# Patient Record
Sex: Male | Born: 1970 | Race: White | Hispanic: No | Marital: Married | Smoking: Never smoker
Health system: Southern US, Community
[De-identification: ages and names within clinical notes are randomized; demographics above are authoritative.]

## PROBLEM LIST (undated history)

## (undated) DIAGNOSIS — G5600 Carpal tunnel syndrome, unspecified upper limb: Secondary | ICD-10-CM

## (undated) DIAGNOSIS — R7303 Prediabetes: Secondary | ICD-10-CM

## (undated) DIAGNOSIS — R1310 Dysphagia, unspecified: Secondary | ICD-10-CM

## (undated) DIAGNOSIS — Z8781 Personal history of (healed) traumatic fracture: Secondary | ICD-10-CM

## (undated) DIAGNOSIS — K219 Gastro-esophageal reflux disease without esophagitis: Secondary | ICD-10-CM

## (undated) DIAGNOSIS — E876 Hypokalemia: Secondary | ICD-10-CM

## (undated) DIAGNOSIS — G4733 Obstructive sleep apnea (adult) (pediatric): Secondary | ICD-10-CM

## (undated) HISTORY — DX: Hypokalemia: E87.6

## (undated) HISTORY — DX: Carpal tunnel syndrome, unspecified upper limb: G56.00

## (undated) HISTORY — DX: Morbid (severe) obesity due to excess calories: E66.01

## (undated) HISTORY — DX: Prediabetes: R73.03

## (undated) HISTORY — DX: Dysphagia, unspecified: R13.10

## (undated) HISTORY — DX: Obstructive sleep apnea (adult) (pediatric): G47.33

## (undated) HISTORY — DX: Gastro-esophageal reflux disease without esophagitis: K21.9

## (undated) HISTORY — DX: Personal history of (healed) traumatic fracture: Z87.81

---

## 1998-08-04 ENCOUNTER — Ambulatory Visit (HOSPITAL_COMMUNITY): Admission: RE | Admit: 1998-08-04 | Discharge: 1998-08-04 | Payer: Self-pay | Admitting: *Deleted

## 2005-09-18 ENCOUNTER — Encounter: Payer: Self-pay | Admitting: Pulmonary Disease

## 2008-07-17 ENCOUNTER — Emergency Department (HOSPITAL_COMMUNITY): Admission: EM | Admit: 2008-07-17 | Discharge: 2008-07-17 | Payer: Self-pay | Admitting: Emergency Medicine

## 2009-08-11 ENCOUNTER — Ambulatory Visit: Payer: Self-pay | Admitting: Pulmonary Disease

## 2009-08-11 DIAGNOSIS — G4733 Obstructive sleep apnea (adult) (pediatric): Secondary | ICD-10-CM | POA: Insufficient documentation

## 2009-08-25 ENCOUNTER — Telehealth: Payer: Self-pay | Admitting: Pulmonary Disease

## 2009-08-31 ENCOUNTER — Encounter: Payer: Self-pay | Admitting: Pulmonary Disease

## 2009-09-26 ENCOUNTER — Telehealth (INDEPENDENT_AMBULATORY_CARE_PROVIDER_SITE_OTHER): Payer: Self-pay | Admitting: *Deleted

## 2009-10-23 ENCOUNTER — Encounter: Payer: Self-pay | Admitting: Pulmonary Disease

## 2010-05-22 ENCOUNTER — Telehealth (INDEPENDENT_AMBULATORY_CARE_PROVIDER_SITE_OTHER): Payer: Self-pay | Admitting: *Deleted

## 2010-05-23 ENCOUNTER — Encounter: Payer: Self-pay | Admitting: Pulmonary Disease

## 2010-07-04 ENCOUNTER — Encounter: Payer: Self-pay | Admitting: Pulmonary Disease

## 2010-07-27 ENCOUNTER — Ambulatory Visit
Admission: RE | Admit: 2010-07-27 | Discharge: 2010-07-27 | Payer: Self-pay | Source: Home / Self Care | Attending: Pulmonary Disease | Admitting: Pulmonary Disease

## 2010-08-10 NOTE — Miscellaneous (Signed)
Summary: Auto BPAP report 06/12/10 to 07/04/10  Clinical Lists Changes Used on 5 of 18 days with average 2hrs 27 min.  Optimal pressure 17/15 with average AHI 12.4.  However, not enough machine usage to adequately determine optimal settings.  Will have my nurse call to schedule next available ROV to discuss status of sleep apnea.  Appended Document: Auto BPAP report 06/12/10 to 07/04/10 [id number] x1  Appended Document: Auto BPAP report 06/12/10 to 07/04/10 [email or web address] x2  Appended Document: Auto BPAP report 06/12/10 to 07/04/10 [id number] X3  Appended Document: Auto BPAP report 06/12/10 to 07/04/10 Pt is coming in 1/19 at 3:00

## 2010-08-10 NOTE — Progress Notes (Signed)
Summary: c pap reading  Phone Note Call from Patient Call back at [phone]   Caller: Significant Other friend Roger Green Call For: sood Summary of Call: calling to get reading results from cpap machine Initial call taken by: Rickard Patience,  September 26, 2009 1:54 PM  Follow-up for Phone Call        pt's family member calling to get results from auto bipap.  Please advise.  thank you.  Roger Millet [name]  September 26, 2009 2:01 PM   Additional Follow-up for Phone Call Additional follow up Details #1::        d/w Roger Green results of download.  Very minimal information from report.  They are not sure if machine is broken. Pt c/o not feeling like any pressure is blowing, and a result can't use.  They have appointment to have machine interogated at [facility] on 03/23.  Will have triage nurse call to schedule patient for next available ROV to review status of OSA and BPAP. Additional Follow-up by: [name] MD,  September 27, 2009 1:04 PM       Appended Document: c pap reading pt girlfriend and pt wants to wait until after they take machine into [facility] tomorrow to see if machine is broken. They state they will call back an let us know what Christoper Allegra finds and if needed then they will schedule an appt.

## 2010-08-10 NOTE — Letter (Signed)
Summary: CMN for CPAP Supplies/[facility]  CMN for CPAP Supplies/[facility]   Imported By: Sherian Rein 11/01/2009 11:10:33  _____________________________________________________________________  External Attachment:    Type:   Image     Comment:   External Document

## 2010-08-10 NOTE — Medication Information (Signed)
Summary: CPAP Compliance/[facility]  CPAP Compliance/[facility]   Imported By: Sherian Rein 10/07/2009 10:24:45  _____________________________________________________________________  External Attachment:    Type:   Image     Comment:   External Document

## 2010-08-10 NOTE — Assessment & Plan Note (Signed)
Summary: discuss sleep apnea/ms   Primary Provider/Referring Provider:  Dr. Donette Larry  CC:  follow-up visit. Pt states he has no used his bpap 1 year and 2 months. Pt states it causes him to have dry mouth and the mask makes him feel like he is choking. Marland Kitchen  History of Present Illness: 40 yo male with severe OSA.  His recent BPAP download from Dec. 2011:  Used on 5 of 18 days with average 2hrs 27 min.  Optimal pressure 17/15 with average AHI 12.4.  However, not enough machine usage to adequately determine optimal settings.  He has been getting dryness in his nose and mouth.  As a result he has to take his mask off.  He has no trouble falling asleep with BPAP.  He feels BPAP is helping otherwise.  He is not as sleep like he was before BPAP set up.  He does not have much leak from his mask.   Current Medications (verified): 1)  Prevacid 30 Mg Cpdr (Lansoprazole) .Marland Kitchen.. 1 By Mouth Daily As Needed  Allergies (verified): No Known Drug Allergies  Past History:  Past Medical History: OSA      - PSG 09/18/05 AHI 84  Past Surgical History: Reviewed history from 08/11/2009 and no changes required. None  Vital Signs:  Patient profile:   40 year old male Height:      73 inches Weight:      414.38 pounds BMI:     54.87 O2 Sat:      95 % on Room air Temp:     99.6 degrees F oral Pulse rate:   98 / minute BP sitting:   128 / 86  (left arm) Cuff size:   large  Vitals Entered By: Carver Fila (July 27, 2010 2:55 PM)  O2 Flow:  Room air CC: follow-up visit. Pt states he has no used his bpap 1 year and 2 months. Pt states it causes him to have dry mouth and the mask makes him feel like he is choking.  Comments meds and allergies updated Phone number updated Carver Fila  July 27, 2010 2:55 PM    Physical Exam  General:  obese.   Nose:  no deformity, discharge, inflammation, or lesions Mouth:  MP 4, enlarged tongue Neck:  no JVD.   Lungs:  clear bilaterally to auscultation and  percussion Heart:  regular rate and rhythm, S1, S2 without murmurs, rubs, gallops, or clicks Extremities:  no clubbing, cyanosis, edema, or deformity noted Neurologic:  normal CN II-XII and strength normal.   Cervical Nodes:  no significant adenopathy Psych:  alert and cooperative; normal mood and affect; normal attention span and concentration   Impression & Recommendations:  Problem # 1:  OBSTRUCTIVE SLEEP APNEA (ICD-327.23) Reviewed his previous sleep test, and recent download results.  Will adjust his BPAP to 17/15.  Will try to get a new machine for him since his current machine is > 5 yrs old.  Will get download on new settings.  If this is unsuccessful, he will need in-lab titration.  Problem # 2:  MORBID OBESITY (ICD-278.01) Explained how his weight is affecting his sleep and health.  Complete Medication List: 1)  Prevacid 30 Mg Cpdr (Lansoprazole) .Marland Kitchen.. 1 by mouth daily as needed  Other Orders: Est. Patient Level III (16109) DME Referral (DME)  Patient Instructions: 1)  Will adjust BPAP to 17/15 cm H2O 2)  Will try to arrange for new BPAP machine 3)  Follow up in 4 months

## 2010-08-10 NOTE — Letter (Signed)
Summary: LMN/[facility]  LMN/[facility]   Imported By: Lester [place] 05/29/2010 08:10:03  _____________________________________________________________________  External Attachment:    Type:   Image     Comment:   External Document

## 2010-08-10 NOTE — Assessment & Plan Note (Signed)
Summary: SLEEP APNEA/[facility]   Primary Provider/Referring Provider:  Dr. Donette Larry  CC:  Sleep consult. Epworth score is 10.   The patient had a sleep study approx 3-4 years ago and has been on Bipap. Pressure 22/18. The [patient has been unable to wear his Bipap for 2 months because he feels he is suffocating.Marland Kitchen  History of Present Illness: 40 yo male for evaluation of sleep apnea.  He had a sleep study done about 3.5 years ago.  This was done in [employer].  He apparently followed a split night protocol.  He was found sleep apnea, and started on BPAP.  He says his pressure setting is 22/18.  He has not had any adjustments in his set up since he started.  He gets his supplies through [facility].  He currently has a full face mask, and uses a humidifer.  He was doing well with his machine until November 2010.  At that time his dog knocked his machine off of it's stand, and he thinks something happened to the machine.  Around that time he went on a hunting trip to [place], and took his machine with him.  When he returned he felt like he was getting smothered whenever he would use his machine.  As a result he has not been using his BPAP machine.  He goes to bed at 9pm, and falls asleep quickly.  He sleeps for about 3 or 4 hours, and wakes up to use the bathroom.  He gets out of bed at 630am.  He feels okay in the morning, and denies headache.  He can fall asleep easily if he is sitting idle.  Since he stopped using BPAP he has been snoring, and can't sleep on his back.  He will occasionally talk in his sleep.  He changed his diet about 2 months ago, and has lost about 15 lbs.  He denies sleep walking, or nightmares.  There is no history of restless legs.  He denies sleep hallucinations, sleep paralysis, or cataplexy.  He is not using anything to help him sleep or stay awake.  He does not have any problems with his driving.  He contacted his DME company to see if something could be done with his machine, and he was  advised to have further sleep evaluation prior to making any adjustments.  Preventive Screening-Counseling & Management  Alcohol-Tobacco     Smoking Status: never  Current Medications (verified): 1)  Prevacid 30 Mg Cpdr (Lansoprazole) .Marland Kitchen.. 1 By Mouth Daily As Needed  Allergies (verified): No Known Drug Allergies  Past History:  Past Medical History: Current Problems:  SLEEP APNEA (ICD-780.57)  Past Surgical History: None  Family History: Reviewed history and no changes required. Heart disease---father and PGF  Social History: Reviewed history and no changes required. Patient never smoked.  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  The patient denies non-productive cough, coughing up blood, chest pain, irregular heartbeats, indigestion, loss of appetite, weight change, abdominal pain, difficulty swallowing, sore throat, tooth/dental problems, headaches, nasal congestion/difficulty breathing through nose, sneezing, itching, anxiety, depression, hand/feet swelling, joint stiffness or pain, rash, change in color of mucus, and fever.    Vital Signs:  Patient profile:   40 year old male Height:      73 inches (185.42 cm) Weight:  390 pounds (177.27 kg) BMI:     51.64 O2 Sat:      93 % on Room air Temp:     99.0 degrees F (37.22 degrees C) oral Pulse rate:   98 / minute BP sitting:   116 / 74  (right arm) Cuff size:   large  Vitals Entered By: Michel Bickers CMA (August 11, 2009 10:25 AM)  O2 Sat at Rest %:  93 O2 Flow:  Room air  Physical Exam  General:  obese.   Eyes:  PERRLA and EOMI.   Nose:  no deformity, discharge, inflammation, or lesions Mouth:  MP 4, enlarged tongue Neck:  no JVD.   Lungs:  clear bilaterally to auscultation and percussion Heart:  regular rate and rhythm, S1, S2  without murmurs, rubs, gallops, or clicks Abdomen:  bowel sounds positive; abdomen soft and non-tender without masses, or organomegaly Extremities:  no clubbing, cyanosis, edema, or deformity noted Neurologic:  CN II-XII grossly intact with normal reflexes, coordination, muscle strength and tone Cervical Nodes:  no significant adenopathy Psych:  alert and cooperative; normal mood and affect; normal attention span and concentration   Impression & Recommendations:  Problem # 1:  OBSTRUCTIVE SLEEP APNEA (ICD-327.23)  Medications Added to Medication List This Visit: 1)  Prevacid 30 Mg Cpdr (Lansoprazole) .Marland Kitchen.. 1 by mouth daily as needed  Complete Medication List: 1)  Prevacid 30 Mg Cpdr (Lansoprazole) .Marland Kitchen.. 1 by mouth daily as needed  Other Orders: Consultation Level IV (04540) DME Referral (DME)  Patient Instructions: 1)  Will schedule sleep test at home 2)  Follow up in 2 months  Appended Document: SLEEP APNEA/[facility] Sleep test from September 18, 2005.  The AHI was 84.  The oxygen nadir was 53%.  He was tried on CPAP initially, but had central apnea and CSR.  Was then titrated to BPAP 22/18 cm H2O.

## 2010-08-10 NOTE — Progress Notes (Signed)
Summary: bi pap pressure  Phone Note Call from Patient   Caller: Significant Other Call For: Charnice Zwilling Summary of Call: pt's girlfriend marsha bowles calling on pt's behalf. pt wants pressure on bi pap increased as he has barely used this the last few night (no more than 30 mins at a time) because he feels he is getting no air. pt uses [facility]. call marsha at [phone] x [phone]. (she is at lunch from 11:30 - 12:30) otherwise can be reached at any time.  Initial call taken by: Tivis Ringer, CNA,  August 25, 2009 10:57 AM  Follow-up for Phone Call        pt has not used bipap but about 30 minutes over the last few nights becaus he feel when he wears it is not able to get any air? Please advise. Carron Curie CMA  August 25, 2009 12:12 PM   Additional Follow-up for Phone Call Additional follow up Details #1::        Do we know if [facility] set up his auto-BPAP titration that was ordered on February 3?  I have not received the download from his machine yet.  Would need to see this first before adjusting the pressure. Additional Follow-up by: [name] MD,  August 25, 2009 8:36 PM     Appended Document: bi pap pressure the download was setup on 08/18/09 and is schedueld to be picked up next week. I advised pt girlfriend that settings could not be chnaged until we get the info from the download. She states he is only wearing the bipap about 30 mins each night and he has skipped a couple of nights. Will this be enough time to get any data? Please advise.

## 2010-08-10 NOTE — Progress Notes (Signed)
Summary: auto bi pap-  Phone Note Call from Patient Call back at [phone]   Caller: Significant Other marcia Call For: sood Summary of Call: need to talk to nurse about  pt getting auto bipap to keep. Initial call taken by: Rickard Patience,  May 22, 2010 5:00 PM  Follow-up for Phone Call        [id number] with daughter.Carron Curie CMA  May 22, 2010 5:04 PM  Leta Jungling returning call.  Same number as yesterday. [employer]  May 23, 2010 8:31 AM   Additional Follow-up for Phone Call Additional follow up Details #1::        Pt not tolerating Respiraonics Cpap.  Feels like it is taking his breath and feels like he is choking.  Cpap is set at 22/18.  Pt took machine to [facility] and machine is working properly.  Christoper Allegra suggested that pt try an auto bipap 5/25cm machine to see if he tolerates this better.  Please advise. Abigail Miyamoto RN  May 23, 2010 9:15 AM     Additional Follow-up for Phone Call Additional follow up Details #2::    Order sent to [facility]. Follow-up by: [name] MD,  May 23, 2010 10:24 AM  Additional Follow-up for Phone Call Additional follow up Details #3:: Details for Additional Follow-up Action Taken: Spoke with pt's spouse and notified that order was sent to [facility]  She verbalized understanding. Additional Follow-up by: Vernie Murders,  May 23, 2010 10:29 AM

## 2010-10-23 LAB — DIFFERENTIAL
Basophils Absolute: 0 10*3/uL (ref 0.0–0.1)
Basophils Relative: 0 % (ref 0–1)
Eosinophils Relative: 2 % (ref 0–5)
Lymphocytes Relative: 10 % — ABNORMAL LOW (ref 12–46)

## 2010-10-23 LAB — URINALYSIS, ROUTINE W REFLEX MICROSCOPIC
Bilirubin Urine: NEGATIVE
Glucose, UA: NEGATIVE mg/dL
Hgb urine dipstick: NEGATIVE
Ketones, ur: NEGATIVE mg/dL
Nitrite: NEGATIVE
Protein, ur: NEGATIVE mg/dL
Specific Gravity, Urine: 1.026 (ref 1.005–1.030)
Urobilinogen, UA: 1 mg/dL (ref 0.0–1.0)
pH: 6 (ref 5.0–8.0)

## 2010-10-23 LAB — LIPASE, BLOOD: Lipase: 24 U/L (ref 11–59)

## 2010-10-23 LAB — COMPREHENSIVE METABOLIC PANEL
ALT: 30 U/L (ref 0–53)
AST: 19 U/L (ref 0–37)
Albumin: 3.7 g/dL (ref 3.5–5.2)
Alkaline Phosphatase: 72 U/L (ref 39–117)
BUN: 10 mg/dL (ref 6–23)
CO2: 26 mEq/L (ref 19–32)
Calcium: 9.4 mg/dL (ref 8.4–10.5)
Chloride: 100 mEq/L (ref 96–112)
Creatinine, Ser: 1.12 mg/dL (ref 0.4–1.5)
GFR calc Af Amer: >60 mL/min (ref >60)
GFR calc non Af Amer: >60 mL/min (ref >60)
Glucose, Bld: 125 mg/dL — ABNORMAL HIGH (ref 70–99)
Potassium: 3.8 mEq/L (ref 3.5–5.1)
Sodium: 135 mEq/L (ref 135–145)
Total Bilirubin: 0.9 mg/dL (ref 0.3–1.2)
Total Protein: 6.9 g/dL (ref 6.0–8.3)

## 2010-10-23 LAB — CBC
HCT: 51.2 % (ref 39.0–52.0)
Hemoglobin: 17.2 g/dL — ABNORMAL HIGH (ref 13.0–17.0)
MCHC: 33.5 g/dL (ref 30.0–36.0)
MCV: 86.2 fL (ref 78.0–100.0)
Platelets: 271 10*3/uL (ref 150–400)
RBC: 5.94 MIL/uL — ABNORMAL HIGH (ref 4.22–5.81)
RDW: 13.6 % (ref 11.5–15.5)
WBC: 9.9 10*3/uL (ref 4.0–10.5)

## 2010-11-06 ENCOUNTER — Inpatient Hospital Stay (INDEPENDENT_AMBULATORY_CARE_PROVIDER_SITE_OTHER)
Admission: RE | Admit: 2010-11-06 | Discharge: 2010-11-06 | Disposition: A | Discharge status: Home / Self Care | Payer: PRIVATE HEALTH INSURANCE | Source: Ambulatory Visit | Attending: Family Medicine | Admitting: Family Medicine

## 2010-11-06 DIAGNOSIS — S91309A Unspecified open wound, unspecified foot, initial encounter: Secondary | ICD-10-CM

## 2012-04-07 ENCOUNTER — Ambulatory Visit (INDEPENDENT_AMBULATORY_CARE_PROVIDER_SITE_OTHER): Payer: BC Managed Care – PPO | Admitting: Medical

## 2012-04-07 ENCOUNTER — Encounter: Payer: Self-pay | Admitting: Medical

## 2012-04-07 VITALS — BP 110/78 | HR 80 | Temp 99.5°F | Resp 18 | Ht 73.0 in | Wt >= 6400 oz

## 2012-04-07 DIAGNOSIS — R7301 Impaired fasting glucose: Secondary | ICD-10-CM

## 2012-04-07 DIAGNOSIS — Z0000 Encounter for general adult medical examination without abnormal findings: Secondary | ICD-10-CM

## 2012-04-07 DIAGNOSIS — G4733 Obstructive sleep apnea (adult) (pediatric): Secondary | ICD-10-CM

## 2012-04-07 NOTE — Patient Instructions (Signed)
Preventative Care for Adults, Male       REGULAR HEALTH EXAMS:  A routine yearly physical is a good way to check in with your primary care provider about your health and preventive screening. It is also an opportunity to share updates about your health and any concerns you have, and receive a thorough all-over exam.   Most health insurance companies pay for at least some preventative services.  Check with your health plan for specific coverages.  WHAT PREVENTATIVE SERVICES DO MEN NEED?  Adult men should have their weight and blood pressure checked regularly.   Men age 35 and older should have their cholesterol levels checked regularly.  Beginning at age 50 and continuing to age 75, men should be screened for colorectal cancer.  Certain people should may need continued testing until age 85.  Other cancer screening may include exams for testicular and prostate cancer.  Updating vaccinations is part of preventative care.  Vaccinations help protect against diseases such as the flu.  Lab tests are generally done as part of preventative care to screen for anemia and blood disorders, to screen for problems with the kidneys and liver, to screen for bladder problems, to check blood sugar, and to check your cholesterol level.  Preventative services generally include counseling about diet, exercise, avoiding tobacco, drugs, excessive alcohol consumption, and sexually transmitted infections.    GENERAL RECOMMENDATIONS FOR GOOD HEALTH:  Healthy diet:  Eat a variety of foods, including fruit, vegetables, animal or vegetable protein, such as meat, fish, chicken, and eggs, or beans, lentils, tofu, and grains, such as rice.  Drink plenty of water daily.  Decrease saturated fat in the diet, avoid lots of red meat, processed foods, sweets, fast foods, and fried foods.  Exercise:  Aerobic exercise helps maintain good heart health. At least 30-40 minutes of moderate-intensity exercise is recommended.  For example, a brisk walk that increases your heart rate and breathing. This should be done on most days of the week.   Find a type of exercise or a variety of exercises that you enjoy so that it becomes a part of your daily life.  Examples are running, walking, swimming, water aerobics, and biking.  For motivation and support, explore group exercise such as aerobic class, spin class, Zumba, Yoga,or  martial arts, etc.    Set exercise goals for yourself, such as a certain weight goal, walk or run in a race such as a 5k walk/run.  Speak to your primary care provider about exercise goals.  Disease prevention:  If you smoke or chew tobacco, find out from your caregiver how to quit. It can literally save your life, no matter how long you have been a tobacco user. If you do not use tobacco, never begin.   Maintain a healthy diet and normal weight. Increased weight leads to problems with blood pressure and diabetes.   The Body Mass Index or BMI is a way of measuring how much of your body is fat. Having a BMI above 27 increases the risk of heart disease, diabetes, hypertension, stroke and other problems related to obesity. Your caregiver can help determine your BMI and based on it develop an exercise and dietary program to help you achieve or maintain this important measurement at a healthful level.  High blood pressure causes heart and blood vessel problems.  Persistent high blood pressure should be treated with medicine if weight loss and exercise do not work.   Fat and cholesterol leaves deposits in your arteries   that can block them. This causes heart disease and vessel disease elsewhere in your body.  If your cholesterol is found to be high, or if you have heart disease or certain other medical conditions, then you may need to have your cholesterol monitored frequently and be treated with medication.   Ask if you should have a stress test if your history suggests this. A stress test is a test done on  a treadmill that looks for heart disease. This test can find disease prior to there being a problem.  Avoid drinking alcohol in excess (more than two drinks per day).  Avoid use of street drugs. Do not share needles with anyone. Ask for professional help if you need assistance or instructions on stopping the use of alcohol, cigarettes, and/or drugs.  Brush your teeth twice a day with fluoride toothpaste, and floss once a day. Good oral hygiene prevents tooth decay and gum disease. The problems can be painful, unattractive, and can cause other health problems. Visit your dentist for a routine oral and dental check up and preventive care every 6-12 months.   Look at your skin regularly.  Use a mirror to look at your back. Notify your caregivers of changes in moles, especially if there are changes in shapes, colors, a size larger than a pencil eraser, an irregular border, or development of new moles.  Safety:  Use seatbelts 100% of the time, whether driving or as a passenger.  Use safety devices such as hearing protection if you work in environments with loud noise or significant background noise.  Use safety glasses when doing any work that could send debris in to the eyes.  Use a helmet if you ride a bike or motorcycle.  Use appropriate safety gear for contact sports.  Talk to your caregiver about gun safety.  Use sunscreen with a SPF (or skin protection factor) of 15 or greater.  Lighter skinned people are at a greater risk of skin cancer. Don't forget to also wear sunglasses in order to protect your eyes from too much damaging sunlight. Damaging sunlight can accelerate cataract formation.   Practice safe sex. Use condoms. Condoms are used for birth control and to help reduce the spread of sexually transmitted infections (or STIs).  Some of the STIs are gonorrhea (the clap), chlamydia, syphilis, trichomonas, herpes, HPV (human papilloma virus) and HIV (human immunodeficiency virus) which causes AIDS.  The herpes, HIV and HPV are viral illnesses that have no cure. These can result in disability, cancer and death.   Keep carbon monoxide and smoke detectors in your home functioning at all times. Change the batteries every 6 months or use a model that plugs into the wall.   Vaccinations:  Stay up to date with your tetanus shots and other required immunizations. You should have a booster for tetanus every 10 years. Be sure to get your flu shot every year, since 5%-20% of the [place] population comes down with the flu. The flu vaccine changes each year, so being vaccinated once is not enough. Get your shot in the fall, before the flu season peaks.   Other vaccines to consider:  Pneumococcal vaccine to protect against certain types of pneumonia.  This is normally recommended for adults age 65 or older.  However, adults younger than 43 years old with certain underlying conditions such as diabetes, heart or lung disease should also receive the vaccine.  Shingles vaccine to protect against Varicella Zoster if you are older than age 60, or younger   than 43 years old with certain underlying illness.  Hepatitis A vaccine to protect against a form of infection of the liver by a virus acquired from food.  Hepatitis B vaccine to protect against a form of infection of the liver by a virus acquired from blood or body fluids, particularly if you work in health care.  If you plan to travel internationally, check with your local health department for specific vaccination recommendations.  Cancer Screening:  Most routine colon cancer screening begins at the age of 50. On a yearly basis, doctors may provide special easy to use take-home tests to check for hidden blood in the stool. Sigmoidoscopy or colonoscopy can detect the earliest forms of colon cancer and is life saving. These tests use a small camera at the end of a tube to directly examine the colon. Speak to your caregiver about this at age 50, when routine  screening begins (and is repeated every 5 years unless early forms of pre-cancerous polyps or small growths are found).   At the age of 50 men usually start screening for prostate cancer every year. Screening may begin at a younger age for those with higher risk. Those at higher risk include African-Americans or having a family history of prostate cancer. There are two types of tests for prostate cancer:   Prostate-specific antigen (PSA) testing. Recent studies raise questions about prostate cancer using PSA and you should discuss this with your caregiver.   Digital rectal exam (in which your doctor's lubricated and gloved finger feels for enlargement of the prostate through the anus).   Screening for testicular cancer.  Do a monthly exam of your testicles. Gently roll each testicle between your thumb and fingers, feeling for any abnormal lumps. The best time to do this is after a hot shower or bath when the tissues are looser. Notify your caregivers of any lumps, tenderness or changes in size or shape immediately.     

## 2012-04-08 ENCOUNTER — Encounter: Payer: Self-pay | Admitting: Medical

## 2012-04-08 LAB — CBC WITH DIFFERENTIAL/PLATELET
Basophils Absolute: 0 10*3/uL (ref 0.0–0.1)
Basophils Relative: 0 % (ref 0–1)
Eosinophils Absolute: 0.3 10*3/uL (ref 0.0–0.7)
Eosinophils Relative: 3 % (ref 0–5)
HCT: 48.5 % (ref 39.0–52.0)
Hemoglobin: 16.3 g/dL (ref 13.0–17.0)
MCH: 28.8 pg (ref 26.0–34.0)
MCHC: 33.6 g/dL (ref 30.0–36.0)
MCV: 85.8 fL (ref 78.0–100.0)
Monocytes Absolute: 0.6 10*3/uL (ref 0.1–1.0)
Monocytes Relative: 8 % (ref 3–12)
RDW: 14.3 % (ref 11.5–15.5)

## 2012-04-08 LAB — COMPREHENSIVE METABOLIC PANEL
ALT: 30 U/L (ref 0–53)
AST: 20 U/L (ref 0–37)
Alkaline Phosphatase: 79 U/L (ref 39–117)
Calcium: 9.8 mg/dL (ref 8.4–10.5)
Chloride: 103 mEq/L (ref 96–112)
Creat: 1.04 mg/dL (ref 0.50–1.35)
Total Bilirubin: 0.5 mg/dL (ref 0.3–1.2)

## 2012-04-08 LAB — HEMOGLOBIN A1C: Mean Plasma Glucose: 128 mg/dL — ABNORMAL HIGH (ref <117)

## 2012-04-08 LAB — LIPID PANEL
LDL Cholesterol: 130 mg/dL — ABNORMAL HIGH (ref 0–99)
Total CHOL/HDL Ratio: 4.4 Ratio
VLDL: 14 mg/dL (ref 0–40)

## 2012-04-08 NOTE — Progress Notes (Signed)
Subjective:   HPI  Roger Green is a 41 y.o. male who presents for a complete physical.  New patient today.  Came in at the urging of his mother and sister who both work in health care.   Mom is nurse at [employer] for 20 years.     He notes no recent medical care.  He did look into bariatric surgery at [facility], wants to pursue this.  He notes that insurer will require a 58mo trial of diet and exercise documented before they will consider surgery.   He can't afford to drive to [facility] every month, and wants to know if we can help him on the diet and exercise plan.  He does have a nutritionist appt end of this month at [facility].  Otherwise been in normal state of health.  He has lost 25 lb in last 3 mo through lifestyle changes.  He still drinks a lot of soda.  He has troubles with his CPAP mask.     Reviewed their medical, surgical, family, social, medication, and allergy history and updated chart as appropriate.  Past Medical History  Diagnosis Date  . Morbid obesity   . OSA (obstructive sleep apnea)   . GERD (gastroesophageal reflux disease)   . History of ankle fracture     bilat as child  . CTS (carpal tunnel syndrome)     mild, uses QHS reinforced wrist splints, R>L    History reviewed. No pertinent past surgical history.  Family History  Problem Relation Age of Onset  . Heart disease Father     MIs in his 90s  . Cancer Neg Hx   . Stroke Neg Hx   . Hyperlipidemia Neg Hx   . Hypertension Neg Hx   . Diabetes Other     maternal side in general    History   Social History  . Marital Status: Single    Spouse Name: N/A    Number of Children: N/A  . Years of Education: N/A   Occupational History  . Not on file.   Social History Main Topics  . Smoking status: Never Smoker   . Smokeless tobacco: Not on file  . Alcohol Use: No  . Drug Use: No  . Sexually Active:    Other Topics Concern  . 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    No current outpatient prescriptions on file prior to visit.    No Known Allergies   Review of Systems Constitutional: denies fever, chills, sweats, unexpected weight change, anorexia, fatigue Allergy: negative; denies recent sneezing, itching, congestion Dermatology: denies changing moles, rash, lumps, new worrisome lesions ENT: no runny nose, ear pain, sore throat, hoarseness, sinus pain, teeth pain, tinnitus, hearing loss, epistaxis Cardiology: denies chest pain, palpitations, edema, orthopnea, paroxysmal nocturnal dyspnea Respiratory: denies cough, shortness of breath, dyspnea on exertion, wheezing, hemoptysis Gastroenterology: denies abdominal pain, nausea, vomiting, diarrhea, constipation, blood in stool, changes in bowel movement, dysphagia Hematology: denies bleeding or bruising problems Musculoskeletal: denies arthralgias, myalgias, joint swelling, back pain, neck pain, cramping, gait changes Ophthalmology: denies vision changes, eye redness, itching, discharge Urology: denies dysuria, difficulty urinating, hematuria, urinary frequency, urgency, incontinence Neurology: no headache, weakness, tingling, +numbness in both hands, hx/o CTS, uses braces, -speech abnormality, memory loss, falls, dizziness Psychology: denies depressed mood, agitation, sleep problems  Objective:   Physical Exam  Filed Vitals:   04/07/12 1006  BP: 110/78  Pulse: 80  Temp: 99.5 F (37.5 C)  Resp: 18    General appearance: alert, no distress, WD/WN, morbidly obese Skin: scattered skin tags in axilla and around neck circumferentially, freckles throughout, several small verrucal appearing lesions (left medial lower leg, right cheek, and others), no worrisome lesions.   Papule of left nasal fold unchanged for years per pt HEENT: normocephalic, conjunctiva/corneas normal, sclerae anicteric,  PERRLA, EOMi, nares patent, no discharge or erythema, pharynx normal Oral cavity: MMM, tongue normal, teeth in good repair Neck: supple, no lymphadenopathy, no thyromegaly, no masses, normal ROM, no bruits Chest: non tender, normal shape and expansion Heart: RRR, normal S1, S2, no murmurs Lungs: CTA bilaterally, no wheezes, rhonchi, or rales Abdomen: +bs, soft, non tender, non distended, no masses, no hepatomegaly, no splenomegaly, no bruits Back: non tender, normal ROM, no scoliosis Musculoskeletal: upper extremities non tender, no obvious deformity, normal ROM throughout, lower extremities non tender, no obvious deformity, normal ROM throughout Extremities: no edema, no cyanosis, no clubbing Pulses: 2+ symmetric, upper and lower extremities, normal cap refill Neurological: alert, oriented x 3, CN2-12 intact, strength normal upper extremities and lower extremities, sensation normal throughout, DTRs 2+ throughout, no cerebellar signs, gait normal Psychiatric: normal affect, behavior normal, pleasant  GU: normal male external genitalia, nontender, no masses, no hernia, no lymphadenopathy Rectal: deferred   Assessment and Plan :    Encounter Diagnoses  Name Primary?  . Routine general medical examination at a health care facility Yes  . Morbid obesity   . Impaired fasting blood sugar   . OSA (obstructive sleep apnea)     Physical exam - discussed healthy lifestyle, diet, exercise, preventative care, vaccinations, and addressed their concerns.    Morbid obesity - advised he limit himself to 1800cal/day diet, discussed diet, advised he document his meals and exercises with My Fitness Pal app for smart phone which he has.  Advised that we would work with him to monitor his efforts.  Impaired fasting glucose on prior labs.  HgbA1C today.  OSA - advised he contact his home health company for refitting of CPAP mask.  Follow-up pending labs.

## 2012-05-13 ENCOUNTER — Ambulatory Visit (INDEPENDENT_AMBULATORY_CARE_PROVIDER_SITE_OTHER): Payer: BC Managed Care – PPO | Admitting: Medical

## 2012-05-13 ENCOUNTER — Encounter: Payer: Self-pay | Admitting: Medical

## 2012-05-13 DIAGNOSIS — R7301 Impaired fasting glucose: Secondary | ICD-10-CM

## 2012-05-13 MED ORDER — PHENTERMINE HCL 37.5 MG PO CAPS: 37.5000 mg | ORAL_CAPSULE | ORAL | Status: DC

## 2012-05-13 NOTE — Patient Instructions (Signed)
Recommendations:  Diet:   I still want you to shoot for a goal of 1800 calories per day.  You have 2000 calories set in the My Fitness Pal app.  Your last 2 week log shows average of 1800 calories per day.   Either continue this strategy or reset the My Fitness Pal max to 1800 calories per day.  I recommend a healthy diet.    Do's:  whole grains such as whole grain pasta, rice, whole grains breads and whole grain cereals.   Eat 2-4 fruits daily Eat beans at least once daily Eat almonds in small quantities at least 3 days per week   If they eat meat, I recommend small portions of lean meats such as chicken, fish, and [place]. Eat as much NON corn and NON potato vegetables as they like, particularly raw or steamed Drink several large glasses of water daily  Cautions: Limit red meat Limit corn and potatoes Limit sweets, cake, pie, candy Limit beer and alcohol Avoid fried food, fast food, large portions Avoid sugary drinks such as regular soda and sweet tea   Exercise:  I recommend at least 4 days per week of exercise for at least 40-60 minutes.   Preferably exercise daily.    This can include walking, swimming, bicycling, hiking, treadmill  Other exercise that helps with the total is parking farther from store entrances, using stairs instead of elevators.   Take small walks when you have down time or at lunch for example.   Begin medication called Phentermine.  This will hopefully help jump start the process of weight loss.   Continue using the My Fitness Pal to log your diet and exercise.    Follow up with the nutritionist as planned.

## 2012-05-13 NOTE — Progress Notes (Signed)
Subjective:  Here for 65mo recheck on supervised weight loss efforts.  He has established with [facility] with Dr. Delrae Alfred.  He has form that outlines some of the required things he must do to qualify for bariatric surgery which he is pursuing.    He is here f/u on labs from last visit showing borderline diabetes.  He is getting exercise.  Deer hunting, walking a lot in the woods, but also active on the job.  He has lost 4lb per his scale at home since last visit.  Overall has lost 50lb since he started this process.  Hasn't seen nutritionist yet.    He has been using My Fitness Pal app to log his exercise and diet.  Of note, app shows that that for all of the last 3 weeks he has maintained 1800 cal/day, but he is still drinking regular soda and sweet tea daily, eating snack pies/cakes, and eating high cal high fat foods such as sausage, white bread and biscuits, fried foods.  No a lot of variety of fruits and vegetables.    Objective: Gen: wd, wn, nad, morbidly obese  Assessment: Encounter Diagnoses  Name Primary?  . Morbid obesity Yes  . Impaired fasting blood sugar    Plan: He has lost 4 lb since last visit.  Begin Phentermine.  discussed risks/benefits.  Gave specific instructions today about diet, exercise, goals.  Mainly needs to make significant diet changes, cutting out soda, sweet tea, baked goods like cakes and pies.  Discussed healthy options for some of the things he seems to eat regularly.     Patient Instructions  Recommendations:  Diet:   I still want you to shoot for a goal of 1800 calories per day.  You have 2000 calories set in the My Fitness Pal app.  Your last 2 week log shows average of 1800 calories per day.   Either continue this strategy or reset the My Fitness Pal max to 1800 calories per day.  I recommend a healthy diet.    Do's:  whole grains such as whole grain pasta, rice, whole grains breads and whole grain cereals.   Eat  2-4 fruits daily Eat beans at least once daily Eat almonds in small quantities at least 3 days per week   If they eat meat, I recommend small portions of lean meats such as chicken, fish, and [place]. Eat as much NON corn and NON potato vegetables as they like, particularly raw or steamed Drink several large glasses of water daily  Cautions: Limit red meat Limit corn and potatoes Limit sweets, cake, pie, candy Limit beer and alcohol Avoid fried food, fast food, large portions Avoid sugary drinks such as regular soda and sweet tea   Exercise:  I recommend at least 4 days per week of exercise for at least 40-60 minutes.   Preferably exercise daily.    This can include walking, swimming, bicycling, hiking, treadmill  Other exercise that helps with the total is parking farther from store entrances, using stairs instead of elevators.   Take small walks when you have down time or at lunch for example.   Begin medication called Phentermine.  This will hopefully help jump start the process of weight loss.   Continue using the My Fitness Pal to log your diet and exercise.    Follow up with the nutritionist as planned.

## 2012-06-12 ENCOUNTER — Ambulatory Visit (INDEPENDENT_AMBULATORY_CARE_PROVIDER_SITE_OTHER): Payer: BC Managed Care – PPO | Admitting: Family Medicine

## 2012-06-12 ENCOUNTER — Encounter: Payer: Self-pay | Admitting: Family Medicine

## 2012-06-12 VITALS — BP 122/70 | HR 102 | Temp 97.9°F | Resp 16

## 2012-06-12 DIAGNOSIS — G4733 Obstructive sleep apnea (adult) (pediatric): Secondary | ICD-10-CM

## 2012-06-12 MED ORDER — PHENTERMINE-TOPIRAMATE ER 3.75-23 MG PO CP24: 1.0000 | ORAL_CAPSULE | ORAL | Status: DC

## 2012-06-12 MED ORDER — PHENTERMINE-TOPIRAMATE ER 7.5-46 MG PO CP24: 1.0000 | ORAL_CAPSULE | ORAL | Status: DC

## 2012-06-12 NOTE — Progress Notes (Signed)
  Subjective:    Patient ID: Roger Green, male    DOB: 10/20/1970, 41 y.o.   MRN: [id number]  HPI He is here for recheck on his weight. He states that he has seen the nutritionist and is scheduled to see her one more time. He has made an effort to change his eating habits but states that he has not lost much weight. He is not taking the phentermine stating his mother did not want him to take it. He does keep himself quite physically active with his work and outside physical activities. He apparently has plans to have surgical intervention to help with his weight loss. He does have underlying sleep apnea.   Review of Systems     Objective:   Physical Exam Alert and in no distress otherwise not examined       Assessment & Plan:   1. OBSTRUCTIVE SLEEP APNEA    2. Morbid obesity  Phentermine-Topiramate (QSYMIA) 3.75-23 MG CP24, Phentermine-Topiramate (QSYMIA) 7.5-46 MG CP24   I again discussed diet and exercise modification with him. Recommended that we try a new weight loss medication to see if this will help with his weight reduction. Encouraged him to continue with making dietary modification. Discussed the benefits of weight reduction regard to diabetes, hypertension, heart disease, sleep apnea. Recheck here in one month.

## 2012-07-04 ENCOUNTER — Telehealth: Payer: Self-pay | Admitting: Internal Medicine

## 2012-07-04 NOTE — Telephone Encounter (Signed)
Faxed over OV notes to [facility]

## 2012-08-23 ENCOUNTER — Other Ambulatory Visit: Payer: Self-pay

## 2013-05-14 ENCOUNTER — Other Ambulatory Visit: Payer: Self-pay

## 2014-10-28 ENCOUNTER — Emergency Department (INDEPENDENT_AMBULATORY_CARE_PROVIDER_SITE_OTHER): Payer: 59

## 2014-10-28 ENCOUNTER — Inpatient Hospital Stay (HOSPITAL_COMMUNITY)
Admission: EM | Admit: 2014-10-28 | Discharge: 2014-10-31 | DRG: 193 | Disposition: A | Discharge status: Home / Self Care | Payer: 59 | Attending: Internal Medicine | Admitting: Internal Medicine

## 2014-10-28 ENCOUNTER — Encounter (HOSPITAL_COMMUNITY): Payer: Self-pay | Admitting: Cardiology

## 2014-10-28 ENCOUNTER — Emergency Department (HOSPITAL_COMMUNITY)
Admission: EM | Admit: 2014-10-28 | Discharge: 2014-10-28 | Disposition: A | Discharge status: Nursing Home (Includes ICF) | Payer: 59 | Source: Home / Self Care | Attending: Family Medicine | Admitting: Family Medicine

## 2014-10-28 ENCOUNTER — Encounter (HOSPITAL_COMMUNITY): Payer: Self-pay | Admitting: Emergency Medicine

## 2014-10-28 DIAGNOSIS — J189 Pneumonia, unspecified organism: Secondary | ICD-10-CM | POA: Diagnosis present

## 2014-10-28 DIAGNOSIS — R0602 Shortness of breath: Secondary | ICD-10-CM | POA: Diagnosis not present

## 2014-10-28 DIAGNOSIS — Z79899 Other long term (current) drug therapy: Secondary | ICD-10-CM | POA: Diagnosis not present

## 2014-10-28 DIAGNOSIS — E662 Morbid (severe) obesity with alveolar hypoventilation: Secondary | ICD-10-CM | POA: Diagnosis present

## 2014-10-28 DIAGNOSIS — R0902 Hypoxemia: Secondary | ICD-10-CM

## 2014-10-28 DIAGNOSIS — E872 Acidosis, unspecified: Secondary | ICD-10-CM | POA: Insufficient documentation

## 2014-10-28 DIAGNOSIS — R7309 Other abnormal glucose: Secondary | ICD-10-CM | POA: Diagnosis present

## 2014-10-28 DIAGNOSIS — E876 Hypokalemia: Secondary | ICD-10-CM | POA: Diagnosis present

## 2014-10-28 DIAGNOSIS — Z833 Family history of diabetes mellitus: Secondary | ICD-10-CM

## 2014-10-28 DIAGNOSIS — Z6841 Body Mass Index (BMI) 40.0 and over, adult: Secondary | ICD-10-CM | POA: Diagnosis not present

## 2014-10-28 DIAGNOSIS — K219 Gastro-esophageal reflux disease without esophagitis: Secondary | ICD-10-CM | POA: Diagnosis present

## 2014-10-28 DIAGNOSIS — J9601 Acute respiratory failure with hypoxia: Secondary | ICD-10-CM | POA: Diagnosis present

## 2014-10-28 DIAGNOSIS — G4733 Obstructive sleep apnea (adult) (pediatric): Secondary | ICD-10-CM | POA: Diagnosis present

## 2014-10-28 LAB — CBC WITH DIFFERENTIAL/PLATELET
BASOS PCT: 1 % (ref 0–1)
Basophils Absolute: 0.1 10*3/uL (ref 0.0–0.1)
EOS ABS: 0 10*3/uL (ref 0.0–0.7)
EOS PCT: 0 % (ref 0–5)
HEMATOCRIT: 46.1 % (ref 39.0–52.0)
Hemoglobin: 15.5 g/dL (ref 13.0–17.0)
LYMPHS PCT: 22 % (ref 12–46)
Lymphs Abs: 0.9 10*3/uL (ref 0.7–4.0)
MCH: 28.9 pg (ref 26.0–34.0)
MCHC: 33.6 g/dL (ref 30.0–36.0)
MCV: 85.8 fL (ref 78.0–100.0)
MONO ABS: 0.2 10*3/uL (ref 0.1–1.0)
Monocytes Relative: 5 % (ref 3–12)
Neutro Abs: 3 10*3/uL (ref 1.7–7.7)
Neutrophils Relative %: 72 % (ref 43–77)
PLATELETS: 134 10*3/uL — AB (ref 150–400)
RBC: 5.37 MIL/uL (ref 4.22–5.81)
RDW: 13.1 % (ref 11.5–15.5)
WBC: 4 10*3/uL (ref 4.0–10.5)

## 2014-10-28 LAB — POCT I-STAT, CHEM 8
BUN: 10 mg/dL (ref 6–23)
CALCIUM ION: 1.12 mmol/L (ref 1.12–1.23)
Chloride: 93 mmol/L — ABNORMAL LOW (ref 96–112)
Creatinine, Ser: 1 mg/dL (ref 0.50–1.35)
GLUCOSE: 111 mg/dL — AB (ref 70–99)
HEMATOCRIT: 49 % (ref 39.0–52.0)
HEMOGLOBIN: 16.7 g/dL (ref 13.0–17.0)
POTASSIUM: 3.6 mmol/L (ref 3.5–5.1)
Sodium: 137 mmol/L (ref 135–145)
TCO2: 31 mmol/L (ref 0–100)

## 2014-10-28 LAB — BASIC METABOLIC PANEL
ANION GAP: 11 (ref 5–15)
BUN: 9 mg/dL (ref 6–23)
CHLORIDE: 96 mmol/L (ref 96–112)
CO2: 30 mmol/L (ref 19–32)
CREATININE: 1.14 mg/dL (ref 0.50–1.35)
Calcium: 8.5 mg/dL (ref 8.4–10.5)
GFR calc non Af Amer: 76 mL/min — ABNORMAL LOW (ref >90)
GFR, EST AFRICAN AMERICAN: 88 mL/min — AB (ref >90)
Glucose, Bld: 116 mg/dL — ABNORMAL HIGH (ref 70–99)
POTASSIUM: 3.2 mmol/L — AB (ref 3.5–5.1)
Sodium: 137 mmol/L (ref 135–145)

## 2014-10-28 LAB — I-STAT CG4 LACTIC ACID, ED: Lactic Acid, Venous: 2.74 mmol/L (ref 0.5–2.0)

## 2014-10-28 LAB — CG4 I-STAT (LACTIC ACID): Lactic Acid, Venous: 0.84 mmol/L (ref 0.5–2.0)

## 2014-10-28 LAB — INFLUENZA PANEL BY PCR (TYPE A & B)
H1N1 flu by pcr: NOT DETECTED
INFLAPCR: NEGATIVE
INFLBPCR: NEGATIVE

## 2014-10-28 LAB — BRAIN NATRIURETIC PEPTIDE: B NATRIURETIC PEPTIDE 5: 4.6 pg/mL (ref 0.0–100.0)

## 2014-10-28 MED ORDER — SODIUM CHLORIDE 0.9 % IJ SOLN: 3.0000 mL | INTRAMUSCULAR | Status: DC | PRN

## 2014-10-28 MED ORDER — ALBUTEROL SULFATE (2.5 MG/3ML) 0.083% IN NEBU
5.0000 mg | INHALATION_SOLUTION | Freq: Once | RESPIRATORY_TRACT | Status: AC
  Administered 2014-10-28: 5 mg via RESPIRATORY_TRACT

## 2014-10-28 MED ORDER — IPRATROPIUM BROMIDE 0.02 % IN SOLN
0.5000 mg | Freq: Once | RESPIRATORY_TRACT | Status: AC
  Administered 2014-10-28: 0.5 mg via RESPIRATORY_TRACT

## 2014-10-28 MED ORDER — ALBUTEROL SULFATE (2.5 MG/3ML) 0.083% IN NEBU: 2.5000 mg | INHALATION_SOLUTION | RESPIRATORY_TRACT | Status: AC | PRN

## 2014-10-28 MED ORDER — ACETAMINOPHEN 650 MG RE SUPP: 650.0000 mg | Freq: Four times a day (QID) | RECTAL | Status: DC | PRN

## 2014-10-28 MED ORDER — CEFTRIAXONE SODIUM IN DEXTROSE 20 MG/ML IV SOLN
1.0000 g | INTRAVENOUS | Status: DC
  Administered 2014-10-29 – 2014-10-30 (×2): 1 g via INTRAVENOUS
  Filled 2014-10-28 (×3): qty 50

## 2014-10-28 MED ORDER — OSELTAMIVIR PHOSPHATE 75 MG PO CAPS
75.0000 mg | ORAL_CAPSULE | Freq: Once | ORAL | Status: AC
  Administered 2014-10-28: 75 mg via ORAL
  Filled 2014-10-28: qty 1

## 2014-10-28 MED ORDER — GUAIFENESIN ER 600 MG PO TB12
1200.0000 mg | ORAL_TABLET | Freq: Two times a day (BID) | ORAL | Status: DC
  Administered 2014-10-28 – 2014-10-31 (×6): 1200 mg via ORAL
  Filled 2014-10-28 (×8): qty 2

## 2014-10-28 MED ORDER — SODIUM CHLORIDE 0.9 % IV SOLN
INTRAVENOUS | Status: DC
  Administered 2014-10-28 – 2014-10-30 (×2): via INTRAVENOUS

## 2014-10-28 MED ORDER — SODIUM CHLORIDE 0.9 % IV SOLN: 250.0000 mL | INTRAVENOUS | Status: DC | PRN

## 2014-10-28 MED ORDER — DEXTROSE 5 % IV SOLN
500.0000 mg | INTRAVENOUS | Status: DC
  Administered 2014-10-29 – 2014-10-30 (×2): 500 mg via INTRAVENOUS
  Filled 2014-10-28 (×3): qty 500

## 2014-10-28 MED ORDER — ENOXAPARIN SODIUM 40 MG/0.4ML ~~LOC~~ SOLN
40.0000 mg | SUBCUTANEOUS | Status: DC
  Administered 2014-10-28: 40 mg via SUBCUTANEOUS
  Filled 2014-10-28 (×2): qty 0.4

## 2014-10-28 MED ORDER — SODIUM CHLORIDE 0.9 % IJ SOLN
3.0000 mL | Freq: Two times a day (BID) | INTRAMUSCULAR | Status: DC
Start: 2014-10-28 — End: 2014-10-31

## 2014-10-28 MED ORDER — ALBUTEROL SULFATE (2.5 MG/3ML) 0.083% IN NEBU
2.5000 mg | INHALATION_SOLUTION | Freq: Four times a day (QID) | RESPIRATORY_TRACT | Status: DC
  Administered 2014-10-28 – 2014-10-30 (×9): 2.5 mg via RESPIRATORY_TRACT
  Filled 2014-10-28 (×10): qty 3

## 2014-10-28 MED ORDER — ONDANSETRON HCL 4 MG PO TABS: 4.0000 mg | ORAL_TABLET | Freq: Four times a day (QID) | ORAL | Status: DC | PRN

## 2014-10-28 MED ORDER — OXYCODONE HCL 5 MG PO TABS: 5.0000 mg | ORAL_TABLET | ORAL | Status: DC | PRN

## 2014-10-28 MED ORDER — IPRATROPIUM-ALBUTEROL 0.5-2.5 (3) MG/3ML IN SOLN
3.0000 mL | Freq: Once | RESPIRATORY_TRACT | Status: AC
  Administered 2014-10-28: 3 mL via RESPIRATORY_TRACT
  Filled 2014-10-28: qty 3

## 2014-10-28 MED ORDER — IPRATROPIUM BROMIDE 0.02 % IN SOLN
0.5000 mg | Freq: Four times a day (QID) | RESPIRATORY_TRACT | Status: DC
  Administered 2014-10-28 – 2014-10-30 (×9): 0.5 mg via RESPIRATORY_TRACT
  Filled 2014-10-28 (×10): qty 2.5

## 2014-10-28 MED ORDER — IPRATROPIUM BROMIDE 0.02 % IN SOLN
RESPIRATORY_TRACT | Status: AC
  Filled 2014-10-28: qty 2.5

## 2014-10-28 MED ORDER — DOCUSATE SODIUM 100 MG PO CAPS
100.0000 mg | ORAL_CAPSULE | Freq: Two times a day (BID) | ORAL | Status: DC
  Administered 2014-10-29 – 2014-10-31 (×5): 100 mg via ORAL
  Filled 2014-10-28 (×8): qty 1

## 2014-10-28 MED ORDER — ALUM & MAG HYDROXIDE-SIMETH 200-200-20 MG/5ML PO SUSP: 30.0000 mL | Freq: Four times a day (QID) | ORAL | Status: DC | PRN

## 2014-10-28 MED ORDER — ONDANSETRON HCL 4 MG/2ML IJ SOLN: 4.0000 mg | Freq: Four times a day (QID) | INTRAMUSCULAR | Status: DC | PRN

## 2014-10-28 MED ORDER — DEXTROSE 5 % IV SOLN
500.0000 mg | Freq: Once | INTRAVENOUS | Status: AC
  Administered 2014-10-28: 500 mg via INTRAVENOUS
  Filled 2014-10-28: qty 500

## 2014-10-28 MED ORDER — ALBUTEROL SULFATE (2.5 MG/3ML) 0.083% IN NEBU
INHALATION_SOLUTION | RESPIRATORY_TRACT | Status: AC
  Filled 2014-10-28: qty 6

## 2014-10-28 MED ORDER — DEXTROMETHORPHAN POLISTIREX ER 30 MG/5ML PO SUER
30.0000 mg | Freq: Two times a day (BID) | ORAL | Status: DC | PRN
  Filled 2014-10-28: qty 5

## 2014-10-28 MED ORDER — DEXTROSE 5 % IV SOLN
1.0000 g | Freq: Once | INTRAVENOUS | Status: AC
  Administered 2014-10-28: 1 g via INTRAVENOUS
  Filled 2014-10-28: qty 10

## 2014-10-28 MED ORDER — ACETAMINOPHEN 325 MG PO TABS: 650.0000 mg | ORAL_TABLET | Freq: Four times a day (QID) | ORAL | Status: DC | PRN

## 2014-10-28 MED ORDER — SODIUM CHLORIDE 0.9 % IJ SOLN
3.0000 mL | Freq: Two times a day (BID) | INTRAMUSCULAR | Status: DC
Start: 2014-10-28 — End: 2014-10-31
  Administered 2014-10-30 – 2014-10-31 (×2): 3 mL via INTRAVENOUS

## 2014-10-28 NOTE — H&P (Signed)
[facility] History and Physical                                                                                    Roger Green, is a 44 y.o. male  MRN: [id number]   DOB - 1970/12/20  Admit Date - 10/28/2014  Outpatient Primary MD for the patient is Ernst Breach, PA-C  Referring Physician:  Trixie Dredge, PA-C  Chief Complaint:   Chief Complaint  Patient presents with  . Shortness of Breath     HPI  Roger Green  is a 44 y.o. 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  He was sent from [facility] with bilateral pneumonia with hypoxia.  He reports he has been sick since Friday 10/22/14, with upper respiratory symptoms.  In the past few days he has developed worsening SOB, HA, Anorexia and fever of up to 102.6 F.  He has been taking motrin and aspirin with no relief.  He had an oxygen saturation of 80% at rest at Urgent Care and was sent to the ER.  Mr Behan reports that he is usually active, and before this episode of SOB he has not had any issues with his breathing.  He has struggled with obesity for some time, and has seen a physician about weight loss in the past.    CXR shows bilateral PNA.  Review of Systems   In addition to the HPI above,  No changes with Vision or hearing, No problems swallowing food or Liquids, No Chest pain, +non productive cough. No Abdominal pain, No Nausea or Vomiting, Had 1 episode of diarrhea a few days ago. No Blood in stool or Urine, No dysuria, No new skin rashes or bruises, No new joints pains-aches,  No new weakness, tingling, numbness in any extremity, No recent weight gain or loss, A full 10 point Review of Systems was done, except as stated above, all other Review of Systems were negative.  Past Medical History  Past Medical History  Diagnosis Date  . Morbid obesity   . OSA (obstructive sleep apnea)   . GERD (gastroesophageal reflux disease)   .  History of ankle fracture     bilat as child  . CTS (carpal tunnel syndrome)     mild, uses QHS reinforced wrist splints, R>L    History reviewed. No pertinent past surgical history.    Social History History  Substance Use Topics  . Smoking status: Never Smoker   . Smokeless tobacco: Not on file  . Alcohol Use: No  Married lives at home, independent with ADLs.  Family History Family History  Problem Relation Age of Onset  . Heart disease Father     MIs in his 52s  . Cancer Neg Hx   . Stroke Neg Hx   . Hyperlipidemia Neg Hx   . Hypertension Neg Hx   . Diabetes Other     maternal side in general    Prior to Admission medications   Medication Sig Start Date End Date Taking? Authorizing Provider  acetaminophen (TYLENOL) 325 MG tablet Take by mouth  every 6 (six) hours as needed for fever.   Yes Historical Provider, MD  ibuprofen (ADVIL,MOTRIN) 200 MG tablet Take 400 mg by mouth every 6 (six) hours as needed for fever.   Yes Historical Provider, MD    No Known Allergies  Physical Exam  Vitals  Blood pressure 129/64, pulse 81, temperature 98.9 F (37.2 C), temperature source Oral, resp. rate 23, weight 191.418 kg (422 lb), SpO2 93 %.   General:  Obese, pleasant male, lying in bed in NAD, wife and mother at bedside.  Psych:  Normal affect and insight, Not Suicidal or Homicidal, Awake Alert, Oriented X 3.  Neuro:   No F.N deficits, ALL C.Nerves Intact, Strength 5/5 all 4 extremities, Sensation intact all 4 extremities.  ENT:  Ears and Eyes appear Normal, Conjunctivae clear, PER. Moist oral mucosa without erythema or exudates.  Neck:  Thick Supple, No lymphadenopathy appreciated  Respiratory:  Min inspiratory crackles on the right.  Cardiac:  Very hard to hear.  RRR, No Murmurs, no LE edema noted, no JVD.    Abdomen:  Massive, Positive bowel sounds, Soft, Non tender, Non distended,  No masses appreciated  Skin:  No Cyanosis, Normal Skin Turgor, focal rash on  right leg (psoriasis?)  Extremities:  Able to move all 4. 5/5 strength in each,  no effusions.  Data Review  CBC  Recent Labs Lab 10/28/14 0952 10/28/14 1131  WBC  --  4.0  HGB 16.7 15.5  HCT 49.0 46.1  PLT  --  134*  MCV  --  85.8  MCH  --  28.9  MCHC  --  33.6  RDW  --  13.1  LYMPHSABS  --  0.9  MONOABS  --  0.2  EOSABS  --  0.0  BASOSABS  --  0.1    Chemistries   Recent Labs Lab 10/28/14 0952 10/28/14 1131  NA 137 137  K 3.6 3.2*  CL 93* 96  CO2  --  30  GLUCOSE 111* 116*  BUN 10 9  CREATININE 1.00 1.14  CALCIUM  --  8.5    Imaging results:   Dg Chest 2 View  10/28/2014   CLINICAL DATA:  Cough, fever.  EXAM: CHEST  2 VIEW  COMPARISON:  None.  FINDINGS: The heart size and mediastinal contours are within normal limits. No pneumothorax or pleural effusion is noted patchy airspace opacity is noted in the right upper lobe as well as the lingular region of the left lung most consistent with pneumonia. The visualized skeletal structures are unremarkable.  IMPRESSION: Bilateral airspace opacities are noted concerning for pneumonia. Short-term follow-up radiographs are recommended to ensure resolution.   Electronically Signed   By: Lupita Raider, M.D.   On: 10/28/2014 10:18    EKG was not done.     Assessment & Plan  Principal Problem:   Acute respiratory failure with hypoxia Active Problems:   Obstructive sleep apnea   Morbid obesity   CAP (community acquired pneumonia)   Hypokalemia  Acute respiratory failure with hypoxia secondary to bilateral PNA Placed patient on oxygen PRN + CPAP QHS. Ambulate daily with pulse ox to test oxygen sats. Checked BNP as well (pending)  Community acquired PNA Admitted with PNA order set so HIV, Urine for legionella and strep, plus influenza PCR are pending. Blood cultures were not drawn in the ER.  Antibiotics were already started - will continue these. (Azith and Rocephin). Supportive care with nebs, delsym, mucinex.    Tamiflu started  empirically in the ER.   Please d/c if influenza PCR is negative.  Hypokalemia. Will give supplementation.  OSA.  Patient used to use CPAP but his equipment no longer works and after multiple attempts to fix it, it still would not work properly. Patient will be placed on CPAP QHS inpatient, and Case Management is requested to help with his CPAP equipment at home.  Mild hyperglycemia Will check Hgb A1c  Morbid obesity Nutrition consultation.   Consultants Called:  none  Family Communication:   Wife and mother at bedside.  Code Status:  full  Condition:  Stable.  Potential Disposition: home 4/22 or 4/23 when hypoxia resolves.  Time spent in minutes : [phone]   Breck Hollinger York,  PA-C on 10/28/2014 at 2:42 PM Between 7am to 7pm - Pager - [phone] After 7pm go to [email or web address] - password [email or web address] And look for the night coverage person covering me after hours  [facility]

## 2014-10-28 NOTE — ED Notes (Signed)
PA at bedside.

## 2014-10-28 NOTE — ED Notes (Signed)
Pt will be transferred to [place] ED via shuttle for further evaluation report called to 1st nurse Megan RN 

## 2014-10-28 NOTE — ED Notes (Signed)
[facility]

## 2014-10-28 NOTE — ED Provider Notes (Signed)
CSN: [id number]     Arrival date & time 10/28/14  0848 History   First MD Initiated Contact with Patient 10/28/14 [phone]     Chief Complaint  Patient presents with  . Cough  . Fever   (Consider location/radiation/quality/duration/timing/severity/associated sxs/prior Treatment) HPI          44 year old male presents for evaluation of cough, mild shortness of breath, dizziness upon standing, and fever. His symptoms started about 5 or 6 days ago with some sinus pressure, congestion, and rhinorrhea. He subsequently developed a cough and has gotten worse since then. His temperature last night was 103.24F. He denies chest pain, NVD, abdominal pain, sore throat. He does not smoke. He has no history of any cardiovascular disease. No leg swelling. No history of DVT or PE. No recent travel    Past Medical History  Diagnosis Date  . Morbid obesity   . OSA (obstructive sleep apnea)   . GERD (gastroesophageal reflux disease)   . History of ankle fracture     bilat as child  . CTS (carpal tunnel syndrome)     mild, uses QHS reinforced wrist splints, R>L   History reviewed. No pertinent past surgical history. Family History  Problem Relation Age of Onset  . Heart disease Father     MIs in his 5350s  . Cancer Neg Hx   . Stroke Neg Hx   . Hyperlipidemia Neg Hx   . Hypertension Neg Hx   . Diabetes Other     maternal side in general   History  Substance Use Topics  . Smoking status: Never Smoker   . Smokeless tobacco: Not on file  . Alcohol Use: No    Review of Systems  Constitutional: Positive for fever, chills and fatigue.  HENT: Positive for congestion, rhinorrhea and sinus pressure.   Respiratory: Positive for cough and shortness of breath. Negative for chest tightness and wheezing.   Cardiovascular: Negative for chest pain and leg swelling.  Gastrointestinal: Positive for diarrhea. Negative for nausea, vomiting and abdominal pain.  Genitourinary: Negative for urgency and frequency.   Skin: Negative for rash.  All other systems reviewed and are negative.   Allergies  Review of patient's allergies indicates no known allergies.  Home Medications   Prior to Admission medications   Medication Sig Start Date End Date Taking? Authorizing Provider  Phentermine-Topiramate (QSYMIA) 3.75-23 MG CP24 Take 1 capsule by mouth 1 day or 1 dose. 06/12/12   Ronnald NianJohn C Lalonde, MD  Phentermine-Topiramate [facility](QSYMIA) 7.5-46 MG CP24 Take 1 capsule by mouth 1 day or 1 dose. 06/12/12   Ronnald NianJohn C Lalonde, MD   BP 120/80 mmHg  Pulse 93  Temp(Src) 98.9 F (37.2 C) (Oral)  Resp 16  SpO2 90% Physical Exam  Constitutional: He is oriented to person, place, and time. He appears well-developed and well-nourished. No distress.  Exam is difficult due to morbidly obese body habitus  HENT:  Head: Normocephalic.  Eyes: Conjunctivae are normal.  Neck: Normal range of motion. Neck supple.  Cardiovascular: Normal rate, regular rhythm, normal heart sounds and intact distal pulses.   Pulmonary/Chest: Effort normal and breath sounds normal. No respiratory distress.  Abdominal: Soft. There is no tenderness.  Neurological: He is alert and oriented to person, place, and time. Coordination normal.  Skin: Skin is warm and dry. No rash noted. He is not diaphoretic.  Psychiatric: He has a normal mood and affect. Judgment normal.  Nursing note and vitals reviewed.   ED Course  Procedures (including  critical care time) Labs Review Labs Reviewed  POCT I-STAT, CHEM 8 - Abnormal; Notable for the following:    Chloride 93 (*)    Glucose, Bld 111 (*)    All other components within normal limits    Imaging Review Dg Chest 2 View  10/28/2014   CLINICAL DATA:  Cough, fever.  EXAM: CHEST  2 VIEW  COMPARISON:  None.  FINDINGS: The heart size and mediastinal contours are within normal limits. No pneumothorax or pleural effusion is noted patchy airspace opacity is noted in the right upper lobe as well as the lingular  region of the left lung most consistent with pneumonia. The visualized skeletal structures are unremarkable.  IMPRESSION: Bilateral airspace opacities are noted concerning for pneumonia. Short-term follow-up radiographs are recommended to ensure resolution.   Electronically Signed   By: Lupita Raider, M.D.   On: 10/28/2014 10:18     MDM   1. Bilateral pneumonia   2. Hypoxia    Bilateral pneumonia with hypoxia, started on O2 and transferred to ED, will likely require admission and IV ABx   Meds ordered this encounter  Medications  . albuterol (PROVENTIL) (2.5 MG/3ML) 0.083% nebulizer solution 5 mg    Sig:   . ipratropium (ATROVENT) nebulizer solution 0.5 mg    Sig:        Graylon Good, PA-C 10/28/14 1044

## 2014-10-28 NOTE — Progress Notes (Signed)
Pt. Doesn't want to wear cpap. Pt. States he will notify if he changes his mind.

## 2014-10-28 NOTE — ED Provider Notes (Signed)
CSN: [id number]     Arrival date & time 10/28/14  1059 History   First MD Initiated Contact with Patient 10/28/14 1113     Chief Complaint  Patient presents with  . Shortness of Breath     (Consider location/radiation/quality/duration/timing/severity/associated sxs/prior Treatment) The history is provided by the patient and medical records.     Patient sent from [facility] for bilateral pneumonia with hypoxia.  Pt has been sick 6-7 days with dry cough, SOB with any activity, lightheadedness/dizziness with standing, fever to 103, nasal congestion, rhinorrhea.  Has taken motrin and aspirin without relief.  Denies CP, sore throat, recent immobilization, leg swelling, personal or family hx blood clot.  He is not a smoker.    PCP Thyra Breed.    Past Medical History  Diagnosis Date  . Morbid obesity   . OSA (obstructive sleep apnea)   . GERD (gastroesophageal reflux disease)   . History of ankle fracture     bilat as child  . CTS (carpal tunnel syndrome)     mild, uses QHS reinforced wrist splints, R>L   History reviewed. No pertinent past surgical history. Family History  Problem Relation Age of Onset  . Heart disease Father     MIs in his 30s  . Cancer Neg Hx   . Stroke Neg Hx   . Hyperlipidemia Neg Hx   . Hypertension Neg Hx   . Diabetes Other     maternal side in general   History  Substance Use Topics  . Smoking status: Never Smoker   . Smokeless tobacco: Not on file  . Alcohol Use: No    Review of Systems  All other systems reviewed and are negative.     Allergies  Review of patient's allergies indicates no known allergies.  Home Medications   Prior to Admission medications   Medication Sig Start Date End Date Taking? Authorizing Provider  Phentermine-Topiramate (QSYMIA) 3.75-23 MG CP24 Take 1 capsule by mouth 1 day or 1 dose. 06/12/12   Ronnald Nian, MD  Phentermine-Topiramate [facility]) 7.5-46 MG CP24 Take 1 capsule by mouth 1 day or 1 dose. 06/12/12    Ronnald Nian, MD   BP 128/67 mmHg  Pulse 85  Temp(Src) 99.9 F (37.7 C) (Oral)  Resp 18  Wt 422 lb (191.418 kg)  SpO2 94% Physical Exam  Constitutional: He appears well-developed and well-nourished. No distress.  Morbidly obese.  Exam somewhat limited due to body habitus.   HENT:  Head: Normocephalic and atraumatic.  Neck: Neck supple.  Cardiovascular: Normal rate and regular rhythm.   Pulmonary/Chest: Effort normal and breath sounds normal. No respiratory distress. He has no wheezes. He has no rales.  Abdominal: Soft. He exhibits no distension and no mass. There is no tenderness. There is no rebound and no guarding.  Musculoskeletal: He exhibits no edema.  Neurological: He is alert. He exhibits normal muscle tone.  Skin: He is not diaphoretic.  Nursing note and vitals reviewed.   ED Course  Procedures (including critical care time) Labs Review Labs Reviewed  BASIC METABOLIC PANEL - Abnormal; Notable for the following:    Potassium 3.2 (*)    Glucose, Bld 116 (*)    GFR calc non Af Amer 76 (*)    GFR calc Af Amer 88 (*)    All other components within normal limits  CBC WITH DIFFERENTIAL/PLATELET - Abnormal; Notable for the following:    Platelets 134 (*)    All other components within  normal limits  I-STAT CG4 LACTIC ACID, ED - Abnormal; Notable for the following:    Lactic Acid, Venous 2.74 (*)    All other components within normal limits  INFLUENZA PANEL BY PCR (TYPE A & B, H1N1)  I-STAT CG4 LACTIC ACID, ED    Imaging Review Dg Chest 2 View  10/28/2014   CLINICAL DATA:  Cough, fever.  EXAM: CHEST  2 VIEW  COMPARISON:  None.  FINDINGS: The heart size and mediastinal contours are within normal limits. No pneumothorax or pleural effusion is noted patchy airspace opacity is noted in the right upper lobe as well as the lingular region of the left lung most consistent with pneumonia. The visualized skeletal structures are unremarkable.  IMPRESSION: Bilateral airspace  opacities are noted concerning for pneumonia. Short-term follow-up radiographs are recommended to ensure resolution.   Electronically Signed   By: Lupita RaiderJames  Green Jr, M.D.   On: 10/28/2014 10:18     EKG Interpretation None       12:50 PM Discussed pt with Dr Effie ShyWentz.  Will add influenza panel, tamiflu.    MDM   Final diagnoses:  CAP (community acquired pneumonia)  Hypoxia    Morbidly obese patient with no significant medical hx with 6 days of cough, SOB, fever found to have bilateral pneumonia on CXR, hypoxic on room air in the ED.  No known risk factors for PE.  Azithromycin, Rocephin, tamiflu started in ED.  Admitted to [facility].  Algis DownsMarianne York, PA-C accepting.      Trixie Dredgemily Chayce Rullo, PA-C 10/28/14 1542  Mancel BaleElliott Wentz, MD 10/28/14 1710

## 2014-10-28 NOTE — ED Notes (Signed)
Pt states that he has had a cough for a week pt reports fever last night of 103 degrees

## 2014-10-28 NOTE — ED Notes (Signed)
[facility]. [facility]

## 2014-10-28 NOTE — Progress Notes (Signed)
Called ED to get report.  RN was busy, walking back to the unit.  Peri MarisAndrew Jani Moronta, MBA, BS, RN

## 2014-10-28 NOTE — ED Notes (Signed)
Family at bedside. 

## 2014-10-29 DIAGNOSIS — E872 Acidosis: Secondary | ICD-10-CM

## 2014-10-29 LAB — STREP PNEUMONIAE URINARY ANTIGEN: Strep Pneumo Urinary Antigen: NEGATIVE

## 2014-10-29 LAB — BASIC METABOLIC PANEL
ANION GAP: 12 (ref 5–15)
BUN: 8 mg/dL (ref 6–23)
CALCIUM: 8.6 mg/dL (ref 8.4–10.5)
CHLORIDE: 98 mmol/L (ref 96–112)
CO2: 29 mmol/L (ref 19–32)
Creatinine, Ser: 0.98 mg/dL (ref 0.50–1.35)
GFR calc Af Amer: >90 mL/min (ref >90)
GFR calc non Af Amer: >90 mL/min (ref >90)
Glucose, Bld: 111 mg/dL — ABNORMAL HIGH (ref 70–99)
Potassium: 3.5 mmol/L (ref 3.5–5.1)
Sodium: 139 mmol/L (ref 135–145)

## 2014-10-29 LAB — CBC
HEMATOCRIT: 45.9 % (ref 39.0–52.0)
HEMOGLOBIN: 15.5 g/dL (ref 13.0–17.0)
MCH: 29.2 pg (ref 26.0–34.0)
MCHC: 33.8 g/dL (ref 30.0–36.0)
MCV: 86.6 fL (ref 78.0–100.0)
Platelets: 164 10*3/uL (ref 150–400)
RBC: 5.3 MIL/uL (ref 4.22–5.81)
RDW: 13.2 % (ref 11.5–15.5)
WBC: 4.2 10*3/uL (ref 4.0–10.5)

## 2014-10-29 LAB — HEMOGLOBIN A1C
HEMOGLOBIN A1C: 6.4 % — AB (ref 4.8–5.6)
Mean Plasma Glucose: 137 mg/dL

## 2014-10-29 LAB — LACTIC ACID, PLASMA: Lactic Acid, Venous: 2.5 mmol/L (ref 0.5–2.0)

## 2014-10-29 LAB — GLUCOSE, CAPILLARY: GLUCOSE-CAPILLARY: 97 mg/dL (ref 70–99)

## 2014-10-29 LAB — HIV ANTIBODY (ROUTINE TESTING W REFLEX): HIV SCREEN 4TH GENERATION: NONREACTIVE

## 2014-10-29 MED ORDER — ENOXAPARIN SODIUM 100 MG/ML ~~LOC~~ SOLN
95.0000 mg | SUBCUTANEOUS | Status: DC
  Administered 2014-10-29 – 2014-10-30 (×2): 95 mg via SUBCUTANEOUS
  Filled 2014-10-29 (×3): qty 1

## 2014-10-29 NOTE — Progress Notes (Addendum)
[facility] PROGRESS NOTE  Roger Green ZOX:[id number] DOB: 08-28-1970 DOA: 10/28/2014 PCP: Ernst Breach, PA-C  Assessment/Plan: 1-acute resp failure with hypoxia: due to CAP, OHS and OSA -patient is afebrile and feeling better-will continue IV antibiotics -continue supportive care, nebulizer tx and use of flutter valve -continue mucinex -patient started on CPAP -continue O2 supplementation and titrate down as tolerated -influenza neg -tamiflu discontinued  2-OSA: as mentioned above will use CPAP QHS -patient might required repeated sleep study in outpatient setting  3-hyperglycemia: will follow A1C  4-morbid obesity: discusssed low calorie diet and encourage him for evaluation at bariatric clinic Body mass index is 55.19 kg/(m^2).  5-hypokalemia: will replete and follow electrolytes in AM  6-GERD: at high risk for reflux given obesity and complaining of dyspepsia  -will start treatment with PPI  7-lactic acidosis: will provide IVF's -repeat lactic acid in am  Code Status: Full Family Communication: mother at bedside Disposition Plan: home when medically stable   Consultants:  None   Procedures:  See below for x-ray reports   Antibiotics:  zithromax 4/21  Rocephin 4/21  HPI/Subjective: Feeling slightly better; still with mild SOB, but no fever. Lactic acid marginally elevated.  Objective: Filed Vitals:   10/29/14 2302  BP:   Pulse:   Temp:   Resp: 18    Intake/Output Summary (Last 24 hours) at 10/29/14 2352 Last data filed at 10/29/14 1828  Gross per 24 hour  Intake    720 ml  Output    300 ml  Net    420 ml   Filed Weights   10/28/14 1107 10/29/14 2301  Weight: 191.418 kg (422 lb) 195.047 kg (430 lb)    Exam:   General:  Morbidly obese, in no acute distress, currently afebrile, feeling better  Cardiovascular: S1 and S2, no rubs or gallops  Respiratory: decrease BS, scattered crackles  Abdomen: obese, soft, no  guarding, positive BS  Musculoskeletal: trace edema bilaterally, positive skin psoriatic changes in   Data Reviewed: Basic Metabolic Panel:  Recent Labs Lab 10/28/14 0952 10/28/14 1131 10/29/14 0627  NA 137 137 139  K 3.6 3.2* 3.5  CL 93* 96 98  CO2  --  30 29  GLUCOSE 111* 116* 111*  BUN CREATININE 1.00 1.14 0.98  CALCIUM  --  8.5 8.6   CBC:  Recent Labs Lab 10/28/14 0952 10/28/14 1131 10/29/14 0627  WBC  --  4.0 4.2  NEUTROABS  --  3.0  --   HGB 16.7 15.5 15.5  HCT 49.0 46.1 45.9  MCV  --  85.8 86.6  PLT  --  134* 164   BNP (last 3 results)  Recent Labs  10/28/14 1710  BNP 4.6   CBG:  Recent Labs Lab 10/29/14 0753  GLUCAP 97    Studies: Dg Chest 2 View  10/28/2014   CLINICAL DATA:  Cough, fever.  EXAM: CHEST  2 VIEW  COMPARISON:  None.  FINDINGS: The heart size and mediastinal contours are within normal limits. No pneumothorax or pleural effusion is noted patchy airspace opacity is noted in the right upper lobe as well as the lingular region of the left lung most consistent with pneumonia. The visualized skeletal structures are unremarkable.  IMPRESSION: Bilateral airspace opacities are noted concerning for pneumonia. Short-term follow-up radiographs are recommended to ensure resolution.   Electronically Signed   By: Lupita Raider, M.D.   On: 10/28/2014 10:18    Scheduled Meds: . albuterol  2.5 mg Nebulization Q6H  . azithromycin  500 mg Intravenous Q24H  . cefTRIAXone (ROCEPHIN)  IV  1 g Intravenous Q24H  . docusate sodium  100 mg Oral BID  . enoxaparin (LOVENOX) injection  95 mg Subcutaneous Q24H  . guaiFENesin  1,200 mg Oral BID  . ipratropium  0.5 mg Nebulization Q6H  . sodium chloride  3 mL Intravenous Q12H  . sodium chloride  3 mL Intravenous Q12H   Continuous Infusions: . sodium chloride 100 mL/hr at 10/29/14 1113    Principal Problem:   Acute respiratory failure with hypoxia Active Problems:   Obstructive sleep apnea    Morbid obesity   CAP (community acquired pneumonia)   Hypokalemia    Time spent: 30 minutes    Vassie LollMadera, Shania Bjelland  [facility] Pager [phone]. If 7PM-7AM, please contact night-coverage at [email or web address], password [email or web address] 10/29/2014, 11:52 PM  LOS: 1 day

## 2014-10-29 NOTE — Plan of Care (Signed)
Problem: Food- and Nutrition-Related Knowledge Deficit (NB-1.1) Goal: Nutrition education Formal process to instruct or train a patient/client in a skill or to impart knowledge to help patients/clients voluntarily manage or modify food choices and eating behavior to maintain or improve health. Outcome: Completed/Met Date Met:  10/29/14  RD consulted for nutrition education regarding weight loss.  Body mass index is 54.16 kg/(m^2). Pt meets criteria for morbid obesity based on current BMI.  RD provided "Weight Loss Tips" handout from the Academy of [employer]etics. Recommended a goal of 1 pounds weight loss per week and a deficit of 500 calories a day. Emphasized the importance of serving sizes and portion sizes of common foods. Provided examples of ways to balance meals/snacks and encouraged intake of high-fiber, whole grain complex carbohydrates. Emphasized the importance of hydration with calorie-free beverages and limiting sugar-sweetened beverages. Teach back method used.  Expect fair compliance.  Current diet order is regular, patient is consuming approximately 100% of meals at this time. Labs and medications reviewed. No further nutrition interventions warranted at this time. RD contact information provided. If additional nutrition issues arise, please re-consult RD.  Kallie Locks, MS, RD, LDN Pager # [phone] After hours/ weekend pager # [phone]

## 2014-10-29 NOTE — Progress Notes (Signed)
Placed pt. On cpap. Pt. Tolerating well at this time.pt. Has 2L of oxygen titrated into the cpap.

## 2014-10-30 DIAGNOSIS — E872 Acidosis, unspecified: Secondary | ICD-10-CM | POA: Insufficient documentation

## 2014-10-30 LAB — BASIC METABOLIC PANEL
Anion gap: 8 (ref 5–15)
BUN: 9 mg/dL (ref 6–23)
CO2: 29 mmol/L (ref 19–32)
Calcium: 8.4 mg/dL (ref 8.4–10.5)
Chloride: 101 mmol/L (ref 96–112)
Creatinine, Ser: 0.9 mg/dL (ref 0.50–1.35)
GFR calc Af Amer: >90 mL/min (ref >90)
GFR calc non Af Amer: >90 mL/min (ref >90)
GLUCOSE: 113 mg/dL — AB (ref 70–99)
POTASSIUM: 3.9 mmol/L (ref 3.5–5.1)
Sodium: 138 mmol/L (ref 135–145)

## 2014-10-30 LAB — GLUCOSE, CAPILLARY: Glucose-Capillary: 104 mg/dL — ABNORMAL HIGH (ref 70–99)

## 2014-10-30 LAB — LACTIC ACID, PLASMA: LACTIC ACID, VENOUS: 1.3 mmol/L (ref 0.5–2.0)

## 2014-10-30 MED ORDER — PANTOPRAZOLE SODIUM 40 MG PO TBEC
40.0000 mg | DELAYED_RELEASE_TABLET | Freq: Every day | ORAL | Status: DC
Start: 2014-10-30 — End: 2014-10-31
  Administered 2014-10-30: 40 mg via ORAL
  Filled 2014-10-30: qty 1

## 2014-10-30 NOTE — Progress Notes (Signed)
Oxygen saturation checked on room air, with patient at rest and ambulating. Oxygen saturation between 94 and 97.5%

## 2014-10-30 NOTE — Progress Notes (Signed)
Placed pt. On cpap. Pt. Requested the nasal mask and is tolerating well. Pt. Asked that the pressure to adjusted down for comfort. Pt. Has 2L oxygen titrated into the cpap.

## 2014-10-30 NOTE — Progress Notes (Signed)
[facility] PROGRESS NOTE  Raiford NobleSamuel L Ahrendt VHQ:[id number] DOB: 1970/09/27 DOA: 10/28/2014 PCP: Ernst BreachYSINGER, DAVID SHANE, PA-C  Assessment/Plan: 1-acute resp failure with hypoxia: due to CAP, OHS and OSA -patient is afebrile and feeling better -will continue IV antibiotics for another 24 hours; anticipate discharge home in a.m. -continue supportive care, nebulizer tx and use of flutter valve -continue mucinex -patient started on CPAP; which will continue daily at bedtime while in the hospital and patient has been instructed to follow a sleep study and adjustment of his CPAP machine in the outpatient setting. -continue O2 supplementation and titrate down as tolerated -Will assess oxygen requirement on room air -influenza neg/tamiflu discontinued  2-OSA: as mentioned above will use CPAP QHS -patient might required repeated sleep study in outpatient setting -Will try nasal prongs and see if he tolerates them even better  3-hyperglycemia:  -CBGs in the low 100 range  -Patient with family history of diabetes  -A1c 6.4 -Right at the borderline to be consider as a prediabetic/diabetic -Patient will like to try lifestyle changes, low carbohydrates diet and weight loss he for initiation of medications. -This is something that'll need to be followed closely by his PCP at discharge.  4-morbid obesity: discusssed low calorie diet and encourage him for evaluation at bariatric clinic Body mass index is 55.19 kg/(m^2).  5-hypokalemia: Repleted and within normal limits.  6-GERD: at high risk for reflux given obesity and complaining of dyspepsia  -will continue treatment with PPI  7-lactic acidosis:  -repeated lactic acid this morning WNL -will d/c IVF's  Code Status: Full Family Communication: mother at bedside Disposition Plan: home when medically stable; most likely home in the morning.   Consultants:  None   Procedures:  See below for x-ray reports   Antibiotics:  zithromax  4/21  Rocephin 4/21  HPI/Subjective: Feeling better and breathing easier. Denies chest pain, nausea, vomiting, abdominal pain. Patient able to tolerate better CPAP overnight  Objective: Filed Vitals:   10/30/14 0920  BP: 127/62  Pulse: 84  Temp: 98 F (36.7 C)  Resp: 19    Intake/Output Summary (Last 24 hours) at 10/30/14 1726 Last data filed at 10/30/14 1430  Gross per 24 hour  Intake 2898.33 ml  Output      0 ml  Net 2898.33 ml   Filed Weights   10/28/14 1107 10/29/14 2301  Weight: 191.418 kg (422 lb) 195.047 kg (430 lb)    Exam:   General:  Morbidly obese, in no acute distress, feeling better and breathing easier today; currently afebrile. No chest pain, no nausea, no vomiting.  Cardiovascular: S1 and S2, no rubs or gallops  Respiratory: decrease BS bibasilar, scattered crackles and rhonchi  Abdomen: obese, soft, no guarding, positive BS  Musculoskeletal: trace edema bilaterally, positive skin psoriatic changes in his left knee area  Data Reviewed: Basic Metabolic Panel:  Recent Labs Lab 10/28/14 0952 10/28/14 1131 10/29/14 0627 10/30/14 0705  NA 137 137 139 138  K 3.6 3.2* 3.5 3.9  CL 93* 96 98 101  CO2  --  30 29 29   GLUCOSE 111* 116* 111* 113*  BUN 10 9 8 9   CREATININE 1.00 1.14 0.98 0.90  CALCIUM  --  8.5 8.6 8.4   CBC:  Recent Labs Lab 10/28/14 0952 10/28/14 1131 10/29/14 0627  WBC  --  4.0 4.2  NEUTROABS  --  3.0  --   HGB 16.7 15.5 15.5  HCT 49.0 46.1 45.9  MCV  --  85.8 86.6  PLT  --  134* 164   BNP (last 3 results)  Recent Labs  10/28/14 1710  BNP 4.6   CBG:  Recent Labs Lab 10/29/14 0753 10/30/14 0802  GLUCAP 97 104*    Studies: No results found.  Scheduled Meds: . albuterol  2.5 mg Nebulization Q6H  . azithromycin  500 mg Intravenous Q24H  . cefTRIAXone (ROCEPHIN)  IV  1 g Intravenous Q24H  . docusate sodium  100 mg Oral BID  . enoxaparin (LOVENOX) injection  95 mg Subcutaneous Q24H  . guaiFENesin  1,200  mg Oral BID  . ipratropium  0.5 mg Nebulization Q6H  . pantoprazole  40 mg Oral Q1200  . sodium chloride  3 mL Intravenous Q12H  . sodium chloride  3 mL Intravenous Q12H   Continuous Infusions: . sodium chloride 100 mL/hr at 10/30/14 0246    Principal Problem:   Acute respiratory failure with hypoxia Active Problems:   Obstructive sleep apnea   Morbid obesity   CAP (community acquired pneumonia)   Hypokalemia   Lactic acidosis    Time spent: 30 minutes    Vassie Loll  [facility] Pager [phone]. If 7PM-7AM, please contact night-coverage at [email or web address], password [facility] 10/30/2014, 5:26 PM  LOS: 2 days

## 2014-10-31 MED ORDER — AMOXICILLIN-POT CLAVULANATE 875-125 MG PO TABS: 1.0000 | ORAL_TABLET | Freq: Two times a day (BID) | ORAL | Status: DC

## 2014-10-31 MED ORDER — IPRATROPIUM-ALBUTEROL 18-103 MCG/ACT IN AERO: 1.0000 | INHALATION_SPRAY | Freq: Four times a day (QID) | RESPIRATORY_TRACT | Status: DC | PRN

## 2014-10-31 MED ORDER — IPRATROPIUM-ALBUTEROL 0.5-2.5 (3) MG/3ML IN SOLN
RESPIRATORY_TRACT | Status: AC
  Filled 2014-10-31: qty 3

## 2014-10-31 MED ORDER — GUAIFENESIN ER 600 MG PO TB12: 600.0000 mg | ORAL_TABLET | Freq: Two times a day (BID) | ORAL | Status: DC

## 2014-10-31 MED ORDER — SACCHAROMYCES BOULARDII 250 MG PO CAPS: 250.0000 mg | ORAL_CAPSULE | Freq: Two times a day (BID) | ORAL | Status: DC

## 2014-10-31 MED ORDER — PANTOPRAZOLE SODIUM 40 MG PO TBEC: 40.0000 mg | DELAYED_RELEASE_TABLET | Freq: Every day | ORAL | Status: DC

## 2014-10-31 MED ORDER — IPRATROPIUM-ALBUTEROL 0.5-2.5 (3) MG/3ML IN SOLN
3.0000 mL | Freq: Four times a day (QID) | RESPIRATORY_TRACT | Status: DC
  Administered 2014-10-31: 3 mL via RESPIRATORY_TRACT

## 2014-10-31 MED ORDER — IPRATROPIUM-ALBUTEROL 0.5-2.5 (3) MG/3ML IN SOLN: 3.0000 mL | RESPIRATORY_TRACT | Status: DC

## 2014-10-31 NOTE — Discharge Summary (Signed)
Physician Discharge Summary  Roger Green ZOX:[id number] DOB: Oct 24, 1970 DOA: 10/28/2014  PCP: Ernst Breach, PA-C  Admit date: 10/28/2014 Discharge date: 10/31/2014  Time spent: >30 minutes  Recommendations for Outpatient Follow-up:  1. Please make sure the patient have a sleep study done and initiation of CPAP 2. Follow-up to his hemoglobin A1c and CBGs during subsequent visits as he has been found to be prediabetic and with an A1c of 6.4; he will like to have a trial of lifestyle changes and low carbohydrate diets before initiation of medications. 3. Repeat chest x-ray in 3-4 weeks to ensure resolution of infiltrates  Discharge Diagnoses:  Principal Problem:   Acute respiratory failure with hypoxia Active Problems:   Obstructive sleep apnea   Morbid obesity   CAP (community acquired pneumonia)   Hypokalemia   Lactic acidosis GERD  Discharge Condition: Stable and improved. At discharge no significant shortness of breath, no fever, complete resolution of his lactic acidosis and good oxygen saturation on room air. Patient will follow with PCP in 10 days  Diet recommendation: Low calorie diet and low carbohydrates  Filed Weights   10/28/14 1107 10/29/14 2301  Weight: 191.418 kg (422 lb) 195.047 kg (430 lb)    History of present illness:  44 y.o. 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 He was sent from [facility] with bilateral pneumonia with hypoxia. He reports he has been sick since Friday 10/22/14, with upper respiratory symptoms. In the past few days he has developed worsening SOB, HA, Anorexia and fever of up to 102.6 F. He has been taking motrin and aspirin with no relief. He had an oxygen saturation of 80% at rest at Urgent Care and was sent to the ER. [name] reports that he is usually active, and before this episode of SOB he has not had any issues with his breathing. He has  struggled with obesity for some time, and has seen a physician about weight loss in the past.   Hospital Course:   1-acute resp failure with hypoxia/SIRS: due to CAP, OHS and OSA -patient was feeling a whole lot better and breathing easier. No fever, complete resolution of his lactic acidosis and normal WBC's at discharge. -Discharge home with prescription for Augmentin to complete 7 more days of treatment. -continue Mucinex, as needed Combivent and use of flutter valve -patient started on CPAP with alto titration during this admission; well tolerated using nasal prongs. Will need sleep study and initiation of CPAP at home again -No oxygen requirement appreciated prior to discharge with good oxygen saturation on room air at rest and with ambulation. -influenza neg/tamiflu discontinued  2-OSA: as mentioned above will use CPAP QHS -patient needs repeated sleep study in outpatient setting in order to receive CPAP machine -He did great and tolerated nasal prongs with auto-titration CPAP while in the hospital  3-hyperglycemia/prediabetes:  -CBGs in the low 100 range  -Patient with family history of diabetes  -A1c 6.4 -Right at the borderline to be consider as a prediabetic/diabetic -Patient will like to try lifestyle changes, low carbohydrates diet and weight loss he for initiation of medications. -This is something that will need to be followed closely by his PCP at discharge.  4-morbid obesity: discusssed low calorie diet and encourage him for evaluation at bariatric clinic Body mass index is 55.19 kg/(m^2). -Patient will require motivation and assistance and most likely will end date needing bypass surgery  5-hypokalemia: Repleted and within normal limits at discharge  6-GERD: at high risk for reflux given obesity and complaining of dyspepsia  -will discharge on treatment with PPI  7-lactic acidosis:  -repeated lactic acid within normal limits after fluid  resuscitation  Procedures:  See below for x-ray reports    Consultations:  None   Discharge Exam: Filed Vitals:   10/31/14 0533  BP: 123/74  Pulse: 82  Temp: 99.3 F (37.4 C)  Resp: 20    General: Morbidly obese, in no acute distress, feeling better and breathing a lot easier on the day of discharge. No fever. Denies chest pain, no nausea, no vomiting.  Cardiovascular: S1 and S2, no rubs or gallops  Respiratory: improved air movement, scattered rhonchi; no crackles  Abdomen: obese, soft, no guarding, positive BS  Musculoskeletal: trace edema bilaterally, positive skin psoriatic changes in his left knee area  Discharge Instructions   Discharge Instructions    Discharge instructions    Complete by:  As directed   Arrange Follow up with PCP in 10 days Take medications as prescribed Follow low calorie diet Increase activity as tolerated          Current Discharge Medication List    START taking these medications   Details  albuterol-ipratropium (COMBIVENT) 18-103 MCG/ACT inhaler Inhale 1 puff into the lungs every 6 (six) hours as needed for wheezing or shortness of breath. Qty: 1 Inhaler, Refills: 1    amoxicillin-clavulanate (AUGMENTIN) 875-125 MG per tablet Take 1 tablet by mouth 2 (two) times daily. Qty: 14 tablet, Refills: 0    guaiFENesin (MUCINEX) 600 MG 12 hr tablet Take 1 tablet (600 mg total) by mouth 2 (two) times daily. Qty: 40 tablet, Refills: 0    pantoprazole (PROTONIX) 40 MG tablet Take 1 tablet (40 mg total) by mouth daily at 12 noon. Qty: 30 tablet, Refills: 1    saccharomyces boulardii (FLORASTOR) 250 MG capsule Take 1 capsule (250 mg total) by mouth 2 (two) times daily. Qty: 60 capsule, Refills: 1      CONTINUE these medications which have NOT CHANGED   Details  acetaminophen (TYLENOL) 325 MG tablet Take by mouth every 6 (six) hours as needed for fever.    ibuprofen (ADVIL,MOTRIN) 200 MG tablet Take 400 mg by mouth every 6 (six) hours  as needed for fever.       No Known Allergies Follow-up Information    Follow up with Ernst BreachYSINGER, DAVID SHANE, PA-C. Schedule an appointment as soon as possible for a visit in 10 days.   Specialty:  Family Medicine   Contact information:   [address] [place] [id number] [phone]       The results of significant diagnostics from this hospitalization (including imaging, microbiology, ancillary and laboratory) are listed below for reference.    Significant Diagnostic Studies: Dg Chest 2 View  10/28/2014   CLINICAL DATA:  Cough, fever.  EXAM: CHEST  2 VIEW  COMPARISON:  None.  FINDINGS: The heart size and mediastinal contours are within normal limits. No pneumothorax or pleural effusion is noted patchy airspace opacity is noted in the right upper lobe as well as the lingular region of the left lung most consistent with pneumonia. The visualized skeletal structures are unremarkable.  IMPRESSION: Bilateral airspace opacities are noted concerning for pneumonia. Short-term follow-up radiographs are recommended to ensure resolution.   Electronically Signed   By: Lupita RaiderJames  Green Jr, M.D.   On: 10/28/2014 10:18   Labs: Basic Metabolic Panel:  Recent  Labs Lab 10/28/14 0952 10/28/14 1131 10/29/14 0627 10/30/14 0705  NA 137 137 139 138  K 3.6 3.2* 3.5 3.9  CL 93* 96 98 101  CO2  --  GLUCOSE 111* 116* 111* 113*  BUN CREATININE 1.00 1.14 0.98 0.90  CALCIUM  --  8.5 8.6 8.4   CBC:  Recent Labs Lab 10/28/14 0952 10/28/14 1131 10/29/14 0627  WBC  --  4.0 4.2  NEUTROABS  --  3.0  --   HGB 16.7 15.5 15.5  HCT 49.0 46.1 45.9  MCV  --  85.8 86.6  PLT  --  134* 164   BNP (last 3 results)  Recent Labs  10/28/14 1710  BNP 4.6    CBG:  Recent Labs Lab 10/29/14 0753 10/30/14 0802  GLUCAP 97 104*    Signed:  Vassie Loll  [facility] 10/31/2014, 11:02 AM

## 2014-10-31 NOTE — Progress Notes (Signed)
Late note entry: O2 sats checked prior to administering am tx. Pt's O2 sat was reading 89% on RA. Pt was in no obvious respiratory distress, and stated he had not had any SOB. Pt was compliant with CPAP 10/29/12. Neb tx given, pt placed back on RA. Pt states he didn't feel like he needed O2 at this time. Roger Green was hooked up to flow meter and set at 2L. [place] within pt's reach, and advised to placed O2 back on if he felt SOB. Pt demonstrates understanding. RT will continue to monitor.

## 2014-11-01 ENCOUNTER — Telehealth: Payer: Self-pay | Admitting: Medical

## 2014-11-01 LAB — LEGIONELLA ANTIGEN, URINE

## 2014-11-01 NOTE — Telephone Encounter (Signed)
This patient was recently hospitalized.   Please call patient about hospital continuity of care.    See how they are doing. Review any medication changes per discharge summary. Schedule follow up appointment and make note on schedule that this is hospital f/u continuity of care visit.   

## 2014-11-02 NOTE — Telephone Encounter (Signed)
Patient has his follow appointment scheduled for 11/10/14

## 2014-11-10 ENCOUNTER — Inpatient Hospital Stay: Payer: Self-pay | Admitting: Medical

## 2014-11-19 ENCOUNTER — Telehealth: Payer: Self-pay | Admitting: Medical

## 2014-11-19 NOTE — Telephone Encounter (Signed)
Needs hospital f/u appt. 

## 2014-11-19 NOTE — Telephone Encounter (Signed)
Left message for pt to call needs a hospital follow per shane

## 2014-11-24 ENCOUNTER — Telehealth: Payer: Self-pay | Admitting: Internal Medicine

## 2014-11-24 NOTE — Telephone Encounter (Signed)
Faxed over medical records to [facility] @ [phone] on 11/15/14

## 2014-12-24 ENCOUNTER — Telehealth: Payer: Self-pay | Admitting: Medical

## 2014-12-24 NOTE — Telephone Encounter (Signed)
Patient states that he has an appointment with another doctor for this . [facility] Dr. Peggye Ley

## 2014-12-24 NOTE — Telephone Encounter (Signed)
      Dr. Gwenlyn Perking has requested a sleep study be set up by pt's PCP.    Of note, from his recent hospitalization, there was recommended f/u including sleep study.   Please call and advise hospital f/u to address.   Mail him this notice if he doesn't answer the phone

## 2015-01-04 ENCOUNTER — Encounter: Payer: Self-pay | Admitting: Neurology

## 2015-01-04 ENCOUNTER — Ambulatory Visit (INDEPENDENT_AMBULATORY_CARE_PROVIDER_SITE_OTHER): Payer: 59 | Admitting: Neurology

## 2015-01-04 VITALS — BP 144/85 | HR 87 | Resp 18 | Ht 74.0 in | Wt >= 6400 oz

## 2015-01-04 DIAGNOSIS — G4719 Other hypersomnia: Secondary | ICD-10-CM | POA: Diagnosis not present

## 2015-01-04 DIAGNOSIS — R51 Headache: Secondary | ICD-10-CM | POA: Diagnosis not present

## 2015-01-04 DIAGNOSIS — G4733 Obstructive sleep apnea (adult) (pediatric): Secondary | ICD-10-CM

## 2015-01-04 DIAGNOSIS — R519 Headache, unspecified: Secondary | ICD-10-CM

## 2015-01-04 NOTE — Progress Notes (Signed)
Subjective:    Patient ID: Roger Green is a 44 y.o. male.  HPI     Roger FoleySaima Angelly Spearing, MD, PhD [facility] [address], [address] [address] [place], [place] [id number]  Dear Dr. Link Green,    I saw your patient, Roger Green, upon your kind request in my neurologic clinic today for initial consultation of his sleep disorder, in particular, reevaluation of his obstructive sleep apnea. The patient is accompanied by his wife, Roger JunkerMarsha, today. As you know, Roger Green is a 44 year old right-handed gentleman with an underlying medical history of reflux disease, and morbid obesity, who has a prior diagnosis of obstructive sleep apnea, but no longer is on CPAP therapy.  The patient was recently hospitalized from 10/28/2014 through 10/31/2014 for pneumonia and hypoxic respiratory failure. I reviewed the hospital records including the discharge summary. He had acute respiratory failure with hypoxia, and was treated for community-acquired pneumonia. He was found to be in lactic acidosis and hypokalemic. During his hospitalization he was treated with AutoPAP and did well.  He had a sleep study about 10 years ago. He reports a diagnosis of severe obstructive sleep apnea. Prior test results are not available for my review today. He had a BiPAP machine through [facility] and states his pressure was 22/18. This is a fairly high pressure. He was on it successfully for years but then started having difficulty with tolerance and sensations of gasping at night. He stopped using his machine. They felt the machine was also defective and his wife states that she took the machine back to the DME company 6 times and eventually he stopped using it and there was no resolution to the problem as I understand. He has struggled with his weight for as long as he can remember. At one time in the past he looked into bariatric surgery and was evaluated for this [facility]. However, this did not come to fruition. He  has discussed with you potentially pursuing bariatric surgical evaluation again. He would be willing to return for sleep study and be retested for sleep apnea and we start CPAP or BiPAP treatment if necessary. He reports daytime somnolence. He does not wake up rested. He has occasional morning headaches and occasional nocturia. His bedtime is around 9 PM and rise time is around 6 AM. He likes to nap when he can. 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 He had a tonsillectomy and adenoidectomy as a child. His Epworth sleepiness score is 13 out of 24 today, fatigue score is 45 out of 63 today. He denies restless leg symptoms.   Snoring, apneic pauses, gasping sounds and irregular breathing pattern are severe per wife. He is a nonsmoker. He drinks alcohol very rarely, maybe once a year, and caffeine occasionally, not necessarily daily.  His Past Medical History Is Significant For: Past Medical History  Diagnosis Date  . Morbid obesity   . OSA (obstructive sleep apnea)   . GERD (gastroesophageal reflux disease)   . History of ankle fracture     bilat as child  . CTS (carpal tunnel syndrome)     mild, uses QHS reinforced wrist splints, R>L  . Dysphagia   . Prediabetes   . Morbid obesity   . Hypokalemia   . GERD (gastroesophageal reflux disease)     His Past Surgical History Is Significant For: No past surgical history on file.  His Family History Is Significant For: Family History  Problem Relation Age of  Onset  . Heart disease Father     MIs in his 9s  . Cancer Neg Hx   . Stroke Neg Hx   . Hyperlipidemia Neg Hx   . Hypertension Neg Hx   . Diabetes Other     maternal side in general    His Social History Is Significant For: History   Social History  . Marital Status: Married    Spouse Name: N/A  . Number of Children: 0  . Years of Education: HS    Occupational History  . Self Employeed     Social History Main Topics  . Smoking status: Never  Smoker   . Smokeless tobacco: Not on file  . Alcohol Use: No  . Drug Use: No  . Sexual Activity: Not on file   Other Topics Concern  . None   Social History Narrative   Married, no children, owns [employer], no exercise other than on the job; hunts and does a lot of outdoor thinks in the fall, traps game.   1 Soda a day    His Allergies Are:  No Known Allergies:   His Current Medications Are:  Outpatient Encounter Prescriptions as of 01/04/2015  Medication Sig  . albuterol-ipratropium (COMBIVENT) 18-103 MCG/ACT inhaler Inhale 1 puff into the lungs every 6 (six) hours as needed for wheezing or shortness of breath. (Patient not taking: Reported on 01/04/2015)  . [DISCONTINUED] acetaminophen (TYLENOL) 325 MG tablet Take by mouth every 6 (six) hours as needed for fever.  . [DISCONTINUED] amoxicillin-clavulanate (AUGMENTIN) 875-125 MG per tablet Take 1 tablet by mouth 2 (two) times daily.  . [DISCONTINUED] guaiFENesin (MUCINEX) 600 MG 12 hr tablet Take 1 tablet (600 mg total) by mouth 2 (two) times daily.  . [DISCONTINUED] ibuprofen (ADVIL,MOTRIN) 200 MG tablet Take 400 mg by mouth every 6 (six) hours as needed for fever.  . [DISCONTINUED] pantoprazole (PROTONIX) 40 MG tablet Take 1 tablet (40 mg total) by mouth daily at 12 noon.  . [DISCONTINUED] saccharomyces boulardii (FLORASTOR) 250 MG capsule Take 1 capsule (250 mg total) by mouth 2 (two) times daily.   No facility-administered encounter medications on file as of 01/04/2015.  :  Review of Systems:  Out of a complete 14 point review of systems, all are reviewed and negative with the exception of these symptoms as listed below:  Review of Systems  Constitutional: Positive for fatigue.  Neurological:       H/O CPAP use ([facility]), Sleep Study in 2006?, No trouble falling asleep, falls asleep during the day, wakes up 3-4 times a night, Snoring, witnessed apnea, daytime tiredness    Objective:  Neurologic Exam  Physical  Exam Physical Examination:   Filed Vitals:   01/04/15 1038  BP: 144/85  Pulse: 87  Resp: 18    General Examination: The patient is a very pleasant 44 y.o. male in no acute distress. He appears well-developed and well-nourished and adequately groomed. He is morbidly obese.   HEENT: Normocephalic, atraumatic, pupils are equal, round and reactive to light and accommodation. Funduscopic exam is normal with sharp disc margins noted. Extraocular tracking is good without limitation to gaze excursion or nystagmus noted. Normal smooth pursuit is noted. Hearing is grossly intact. Tympanic membranes are clear bilaterally. Face is symmetric with normal facial animation and normal facial sensation. Speech is clear with no dysarthria noted. There is no hypophonia. There is no lip, neck/head, jaw or voice tremor. Neck is supple with full range of passive and active motion. There  are no carotid bruits on auscultation. Oropharynx exam reveals: mild mouth dryness, adequate dental hygiene and marked airway crowding, due to narrow airway entry, large and swollen uvula, larger tongue. Mallampati is class III. Tongue protrudes centrally and palate elevates symmetrically. Tonsils are absent. Neck size is 22 7/8 inches. He has a Mild overbite. Nasal inspection reveals no significant nasal mucosal bogginess or redness and no septal deviation.   Chest: Clear to auscultation without wheezing, rhonchi or crackles noted.  Heart: S1+S2+0, regular and normal without murmurs, rubs or gallops noted.   Abdomen: Soft, non-tender and non-distended with normal bowel sounds appreciated on auscultation.  Extremities: There is trace pitting edema in the distal lower extremities bilaterally. Pedal pulses are intact.  Skin: Warm and dry without trophic changes noted. There are no varicose veins.  Musculoskeletal: exam reveals no obvious joint deformities, tenderness or joint swelling or erythema.   Neurologically:  Mental status:  The patient is awake, alert and oriented in all 4 spheres. His immediate and remote memory, attention, language skills and fund of knowledge are appropriate. There is no evidence of aphasia, agnosia, apraxia or anomia. Speech is clear with normal prosody and enunciation. Thought process is linear. Mood is normal and affect is normal.  Cranial nerves II - XII are as described above under HEENT exam. In addition: shoulder shrug is normal with equal shoulder height noted. Motor exam: Normal bulk, strength and tone is noted. There is no drift, tremor or rebound. Romberg is negative. Reflexes are 2+ throughout. Babinski: Toes are flexor bilaterally. Fine motor skills and coordination: intact with normal finger taps, normal hand movements, normal rapid alternating patting, normal foot taps and normal foot agility.  Cerebellar testing: No dysmetria or intention tremor on finger to nose testing. Heel to shin is unremarkable bilaterally. There is no truncal or gait ataxia.  Sensory exam: intact to light touch, pinprick, vibration, temperature sense in the upper and lower extremities.  Gait, station and balance: He stands with difficulty. No veering to one side is noted. No leaning to one side is noted. Posture is age-appropriate and stance is narrow based. Gait shows normal stride length and normal pace. No problems turning are noted. He turns en bloc. Tandem walk is slightly difficult for him, likely due to body habitus.                Assessment and Plan:   In summary, DEMARRIUS GUERRERO is a very pleasant 44 y.o.-year old male with an underlying medical history of reflux disease, and morbid obesity, who has a prior diagnosis of severe obstructive sleep apnea, no longer on CPAP or BiPAP. He presents for reevaluation and to help restart treatment if possible. He may again require BiPAP therapy because of high pressure requirements.  I had a long chat with the patient and his wife about my findings and the diagnosis  of OSA, its prognosis and treatment options. We talked about medical treatments, surgical interventions and non-pharmacological approaches. I explained in particular the risks and ramifications of untreated moderate to severe OSA, especially with respect to developing cardiovascular disease down the Road, including congestive heart failure, difficult to treat hypertension, cardiac arrhythmias, or stroke. Even type 2 diabetes has, in part, been linked to untreated OSA. Symptoms of untreated OSA include daytime sleepiness, memory problems, mood irritability and mood disorder such as depression and anxiety, lack of energy, as well as recurrent headaches, especially morning headaches. We talked about trying to maintain a healthy lifestyle in general, as  well as the importance of weight control. I encouraged the patient to eat healthy, exercise daily and keep well hydrated, to keep a scheduled bedtime and wake time routine, to not skip any meals and eat healthy snacks in between meals. I advised the patient not to drive when feeling sleepy. I recommended the following at this time: sleep study with potential positive airway pressure titration. (We will score hypopneas at 4% and split the sleep study into diagnostic and treatment portion, if the estimated. 2 hour AHI is >20/h).   I explained the sleep test procedure to the patient and also outlined possible surgical and non-surgical treatment options of OSA, including the use of a custom-made dental device (which would require a referral to a specialist dentist or oral surgeon), upper airway surgical options, such as pillar implants, radiofrequency surgery, tongue base surgery, and UPPP (which would involve a referral to an ENT surgeon). Rarely, jaw surgery such as mandibular advancement may be considered.  I also explained the CPAP treatment option to the patient, who indicated that he would be willing to use PAP again, if the need arises. I explained the importance  of being compliant with PAP treatment, not only for insurance purposes but primarily to improve His symptoms, and for the patient's long term health benefit, including to reduce His cardiovascular risks. I answered all their questions today and the patient and his wife were in agreement. I would like to see him back after the sleep study is completed and encouraged him to call with any interim questions, concerns, problems or updates.   Thank you very much for allowing me to participate in the care of this nice patient. If I can be of any further assistance to you please do not hesitate to call me at [phone].  Sincerely,   Roger Foley, MD, PhD

## 2015-01-04 NOTE — Patient Instructions (Signed)

## 2015-02-21 ENCOUNTER — Ambulatory Visit (INDEPENDENT_AMBULATORY_CARE_PROVIDER_SITE_OTHER): Payer: 59 | Admitting: Neurology

## 2015-02-21 DIAGNOSIS — G4730 Sleep apnea, unspecified: Secondary | ICD-10-CM

## 2015-02-21 DIAGNOSIS — G4733 Obstructive sleep apnea (adult) (pediatric): Secondary | ICD-10-CM | POA: Diagnosis not present

## 2015-02-21 DIAGNOSIS — G4710 Hypersomnia, unspecified: Secondary | ICD-10-CM

## 2015-02-22 ENCOUNTER — Telehealth: Payer: Self-pay | Admitting: Neurology

## 2015-02-22 DIAGNOSIS — G4733 Obstructive sleep apnea (adult) (pediatric): Secondary | ICD-10-CM

## 2015-02-22 NOTE — Sleep Study (Signed)
Please see the scanned sleep study interpretation located in the Procedure tab within the Chart Review section. 

## 2015-02-22 NOTE — Telephone Encounter (Signed)
Patient seen on 01/04/15, split night sleep study on 02/21/15, ins: [employer] Lafonda Mosses:   Please call and notify patient that the recent sleep study confirmed the diagnosis of (very!) severe OSA. He did very well with BiPAP during the study with significant improvement of the respiratory events. Therefore, I would like start the patient on BiPAP therapy - ASAP! - at home by prescribing a machine for home use. I placed the order in the chart. The patient will need a follow up appointment with me in 8 to 10 weeks post set up that has to be scheduled; please go ahead and schedule while you have the patient on the phone and make sure patient understands the importance of keeping this window for the FU appointment, as it is often an insurance requirement and failing to adhere to this may result in losing coverage for sleep apnea treatment.   Please re-enforce the importance of compliance with treatment and the need for [place] to monitor compliance data - again an insurance requirement and good feedback for the patient as far as how they are doing.  Also remind patient, that any upcoming BiPAP machine or mask issues, should be first addressed with the DME company. Please ask if patient has a preference regarding DME company.  Please arrange for PAP set up at home through a DME company of patient's choice - once you have spoken to the patient - and faxed/routed report to PCP and referring MD (if other than PCP), you can close this encounter, thanks,   Huston Foley, MD, PhD [facility]

## 2015-02-24 NOTE — Telephone Encounter (Signed)
I spoke to wife, Johnny Bridge. She is aware of results and will pass on to patient. She asked that we go ahead and refer him. I sent referral to [facility] urgently. I will also fax result to PCP. I will send letter to patient reminding him to call for f/u appt and express importance of compliance.

## 2015-05-09 ENCOUNTER — Ambulatory Visit (INDEPENDENT_AMBULATORY_CARE_PROVIDER_SITE_OTHER): Payer: 59 | Admitting: Neurology

## 2015-05-09 ENCOUNTER — Encounter: Payer: Self-pay | Admitting: Neurology

## 2015-05-09 VITALS — BP 138/82 | HR 78 | Resp 18 | Ht 74.0 in | Wt >= 6400 oz

## 2015-05-09 DIAGNOSIS — G4733 Obstructive sleep apnea (adult) (pediatric): Secondary | ICD-10-CM

## 2015-05-09 NOTE — Patient Instructions (Addendum)
Please continue using your BiPAP regularly. While your insurance requires that you use BiPAP at least 4 hours each night on 70% of the nights, I recommend, that you not skip any nights and use it throughout the night if you can. Getting used to BiPAP and staying with the treatment long term does take time and patience and discipline. Untreated obstructive sleep apnea when it is moderate to severe can have an adverse impact on cardiovascular health and raise her risk for heart disease, arrhythmias, hypertension, congestive heart failure, stroke and diabetes. Untreated obstructive sleep apnea causes sleep disruption, nonrestorative sleep, and sleep deprivation. This can have an impact on your day to day functioning and cause daytime sleepiness and impairment of cognitive function, memory loss, mood disturbance, and problems focussing. Using BiPAP regularly can improve these symptoms.  Keep up the good work! I will see you back in 6 months for sleep apnea check up, and if you continue to do well on BiPAP I will see you once a year thereafter.    

## 2015-05-09 NOTE — Progress Notes (Signed)
Subjective:    Patient ID: Roger Green is a 44 y.o. male.  HPI     Interim history:   Roger Green is a 44 year old right-handed gentleman with an underlying medical history of reflux disease, and morbid obesity, who presents for follow-up consultation of his obstructive sleep apnea, after his recent split-night sleep study. The patient is unaccompanied today. I first met him on 01/04/2015 at the request of his primary care physician, at which time the patient reported a prior history of OSA but he was no longer on BiPAP therapy. I invited him back for sleep study. He had a split-night sleep study on 02/21/2015 and I went over his test results with him in detail today. His baseline sleep efficiency was 86.5% with a latency to sleep of 12.5 minutes and wake after sleep onset of 11.5 minutes with moderate sleep fragmentation noted. He had an increased arousal index, he had absence of slow-wave sleep and 19.9% of REM sleep prior to CPAP initiation. REM latency was high normal. He had no significant PLMS, EKG or EEG changes. He had loud and moderate snoring. He had a total AHI of 99.3 per hour, average oxygen saturation was only 84%, nadir was 56%. He was then titrated on CPAP therapy. Sleep efficiency on the second part of the study was 93.7%, sleep latency 9.5 minutes and wake after sleep onset was 7 minutes. Arousal index was improved. He had absence of slow-wave sleep and 21% of REM sleep. Average oxygen saturation was much improved at 92%, nadir was 82%, and snoring was eliminated, no significant PLMS or EKG changes were seen. He was started first on CPAP therapy at 5 cm and titrated to 20 cm but then switched to BiPAP due to high pressure requirement. He was titrated to a pressure of 17/13 cm, AHI was 0 per hour and that pressure. Based on the test results are prescribed BiPAP therapy for home use.  Today, 05/09/2015: I reviewed his BiPAP compliance data from 04/05/2015 through 05/04/2015 which is a  total of 30 days during which time he used his machine 100% with percent used days greater than 4 hours at 100%, indicating superb compliance with an average usage of 7 hours and 8 minutes, residual AHI low at 1.5 per hour, leak at times very high with the 95th percentile at 105.7 L/m on a pressure of 17/13 cm.  Today, 05/09/2015: He reports doing better. He feels better rested. He is fully compliant with treatment. He is looking into bariatric surgery. He is hopeful that he can lose some weight. Overall, his sleep is much better. He has no new complaints.  Previously:  01/04/2015: He has a prior diagnosis of obstructive sleep apnea, but no longer is on CPAP therapy.  The patient was recently hospitalized from 10/28/2014 through 10/31/2014 for pneumonia and hypoxic respiratory failure. I reviewed the hospital records including the discharge summary. He had acute respiratory failure with hypoxia, and was treated for community-acquired pneumonia. He was found to be in lactic acidosis and hypokalemic. During his hospitalization he was treated with AutoPAP and did well.  He had a sleep study about 10 years ago. He reports a diagnosis of severe obstructive sleep apnea. Prior test results are not available for my review today. He had a BiPAP machine through [place] and states his pressure was 22/18. This is a fairly high pressure. He was on it successfully for years but then started having difficulty with tolerance and sensations of gasping at night. He stopped  using his machine. They felt the machine was also defective and his wife states that she took the machine back to the DME company 6 times and eventually he stopped using it and there was no resolution to the problem as I understand. He has struggled with his weight for as long as he can remember. At one time in the past he looked into bariatric surgery and was evaluated for this at [facility]. However, this did not come to fruition. He has discussed with you  potentially pursuing bariatric surgical evaluation again. He would be willing to return for sleep study and be retested for sleep apnea and we start CPAP or BiPAP treatment if necessary. He reports daytime somnolence. He does not wake up rested. He has occasional morning headaches and occasional nocturia. His bedtime is around 9 PM and rise time is around 6 AM. He likes to nap when he can. 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 He had a tonsillectomy and adenoidectomy as a child. His Epworth sleepiness score is 13 out of 24 today, fatigue score is 45 out of 63 today. He denies restless leg symptoms.   Snoring, apneic pauses, gasping sounds and irregular breathing pattern are severe per wife. He is a nonsmoker. He drinks alcohol very rarely, maybe once a year, and caffeine occasionally, not necessarily daily.  His Past Medical History Is Significant For: Past Medical History  Diagnosis Date  . Morbid obesity ([place])   . OSA (obstructive sleep apnea)   . GERD (gastroesophageal reflux disease)   . History of ankle fracture     bilat as child  . CTS (carpal tunnel syndrome)     mild, uses QHS reinforced wrist splints, R>L  . Dysphagia   . Prediabetes   . Morbid obesity ([place])   . Hypokalemia   . GERD (gastroesophageal reflux disease)     His Past Surgical History Is Significant For: No past surgical history on file.  His Family History Is Significant For: Family History  Problem Relation Age of Onset  . Heart disease Father     MIs in his 79s  . Cancer Neg Hx   . Stroke Neg Hx   . Hyperlipidemia Neg Hx   . Hypertension Neg Hx   . Diabetes Other     maternal side in general    His Social History Is Significant For: Social History   Social History  . Marital Status: Married    Spouse Name: N/A  . Number of Children: 0  . Years of Education: HS    Occupational History  . Self Employeed     Social History Main Topics  . Smoking status: Never Smoker    . Smokeless tobacco: None  . Alcohol Use: No  . Drug Use: No  . Sexual Activity: Not Asked   Other Topics Concern  . None   Social History Narrative   Married, no children, owns [employer], no exercise other than on the job; hunts and does a lot of outdoor thinks in the fall, traps game.   1 Soda a day    His Allergies Are:  No Known Allergies:   His Current Medications Are:  Outpatient Encounter Prescriptions as of 05/09/2015  Medication Sig  . [DISCONTINUED] albuterol-ipratropium (COMBIVENT) 18-103 MCG/ACT inhaler Inhale 1 puff into the lungs every 6 (six) hours as needed for wheezing or shortness of breath. (Patient not taking: Reported on 01/04/2015)   No facility-administered encounter medications on file as  of 05/09/2015.  :  Review of Systems:  Out of a complete 14 point review of systems, all are reviewed and negative with the exception of these symptoms as listed below:   Review of Systems  Neurological:       Patient is here for CPAP f/u. Reports that he is doing well on the machine. No new concerns.     Objective:  Neurologic Exam  Physical Exam Physical Examination:   Filed Vitals:   05/09/15 0826  BP: 138/82  Pulse: 78  Resp: 18    General Examination: The patient is a very pleasant 44 y.o. male in no acute distress. He appears well-developed and well-nourished and adequately groomed. He is morbidly obese. He is in good spirits today.  HEENT: Normocephalic, atraumatic, pupils are equal, round and reactive to light and accommodation. Extraocular tracking is good without limitation to gaze excursion or nystagmus noted. Normal smooth pursuit is noted. Hearing is grossly intact. Face is symmetric with normal facial animation and normal facial sensation. Speech is clear with no dysarthria noted. There is no hypophonia. There is no lip, neck/head, jaw or voice tremor. Neck is supple with full range of passive and active motion. There are no carotid  bruits on auscultation. Oropharynx exam reveals: mild mouth dryness, adequate dental hygiene and marked airway crowding, due to narrow airway entry, large and swollen uvula, larger tongue. Mallampati is class III. Tongue protrudes centrally and palate elevates symmetrically. Tonsils are absent.   Chest: Clear to auscultation without wheezing, rhonchi or crackles noted.  Heart: S1+S2+0, regular and normal without murmurs, rubs or gallops noted.   Abdomen: Soft, non-tender and non-distended with normal bowel sounds appreciated on auscultation.  Extremities: There is trace to 1+ pitting edema in the distal lower extremities bilaterally, R > L, chronic stasis like changes noted. Pedal pulses are intact.  Skin: Warm and dry without trophic changes noted. There are no varicose veins.  Musculoskeletal: exam reveals no obvious joint deformities, tenderness or joint swelling or erythema, he had issues with both ankles.   Neurologically:  Mental status: The patient is awake, alert and oriented in all 4 spheres. His immediate and remote memory, attention, language skills and fund of knowledge are appropriate. There is no evidence of aphasia, agnosia, apraxia or anomia. Speech is clear with normal prosody and enunciation. Thought process is linear. Mood is normal and affect is normal.  Cranial nerves II - XII are as described above under HEENT exam. In addition: shoulder shrug is normal with equal shoulder height noted. Motor exam: Normal bulk, strength and tone is noted. There is no drift, tremor or rebound. Romberg is negative. Reflexes are 1+ throughout. Fine motor skills and coordination: intact with normal finger taps, normal hand movements, normal rapid alternating patting, normal foot taps and normal foot agility.  Cerebellar testing: No dysmetria or intention tremor on finger to nose testing. Heel to shin is unremarkable bilaterally. There is no truncal or gait ataxia.  Sensory exam: intact to light  touch in the upper and lower extremities.  Gait, station and balance: He stands with difficulty. No veering to one side is noted. No leaning to one side is noted. Posture is age-appropriate and stance is narrow based. Gait shows normal stride length and normal pace. No problems turning are noted. He turns en bloc. Tandem walk is slightly difficult for him, likely due to body habitus, unchanged.                Assessment and  Plan:   In summary, Roger Green is a very pleasant 44 year old male with an underlying medical history of reflux disease, and morbid obesity, who presents for follow up consultation of his severe obstructive sleep apnea, now back on BiPAP. He had a split-night sleep study on 02/21/2015 and I went over his test results with him in detail today. He did great on BiPAP therapy. He is compliant with treatment. His physical exam is stable. He is looking into bariatric surgery to help with weight loss. He has been morbidly obese for years. He has a beard which likely accounts for the increase in leak. He uses a fullface mask which is also more prone to air leaking. He is willing to tremor his facial hair some more. He is motivated to continue BiPAP therapy and also to strive for weight loss.  I again explained in particular the risks and ramifications of untreated moderate to severe OSA, especially with respect to developing cardiovascular disease down the Road, including congestive heart failure, difficult to treat hypertension, cardiac arrhythmias, or stroke. Even type 2 diabetes has, in part, been linked to untreated OSA. Symptoms of untreated OSA include daytime sleepiness, memory problems, mood irritability and mood disorder such as depression and anxiety, lack of energy, as well as recurrent headaches, especially morning headaches. We talked about trying to maintain a healthy lifestyle in general, as well as the importance of weight control. I encouraged the patient to eat healthy,  exercise daily and keep well hydrated, to keep a scheduled bedtime and wake time routine, to not skip any meals and eat healthy snacks in between meals. I advised the patient not to drive when feeling sleepy.  I recommended the following at this time: Continue to be fully compliant with positive airway pressure treatment. Strive for weight loss. He is advised to drink more water to stay better hydrated.  I explained the importance of being compliant with PAP treatment, not only for insurance purposes but primarily to improve His symptoms, and for the patient's long term health benefit, including to reduce His cardiovascular risks. I would like to see him back in 6 months, sooner if needed. I answered all his questions today and the patient was in agreement. I encouraged him to call with any interim questions, concerns, problems or updates.   I spent 15 minutes in total face-to-face time with the patient, more than 50% of which was spent in counseling and coordination of care, reviewing test results, reviewing medication and discussing or reviewing the diagnosis of OSA, its prognosis and treatment options.

## 2015-09-18 DIAGNOSIS — G4733 Obstructive sleep apnea (adult) (pediatric): Secondary | ICD-10-CM | POA: Diagnosis not present

## 2015-10-19 DIAGNOSIS — G4733 Obstructive sleep apnea (adult) (pediatric): Secondary | ICD-10-CM | POA: Diagnosis not present

## 2015-11-07 ENCOUNTER — Encounter: Payer: Self-pay | Admitting: Neurology

## 2015-11-07 ENCOUNTER — Ambulatory Visit (INDEPENDENT_AMBULATORY_CARE_PROVIDER_SITE_OTHER): Payer: BLUE CROSS/BLUE SHIELD | Admitting: Neurology

## 2015-11-07 VITALS — BP 136/62 | HR 82 | Resp 20 | Ht 74.0 in | Wt >= 6400 oz

## 2015-11-07 DIAGNOSIS — G4733 Obstructive sleep apnea (adult) (pediatric): Secondary | ICD-10-CM

## 2015-11-07 DIAGNOSIS — T1502XA Foreign body in cornea, left eye, initial encounter: Secondary | ICD-10-CM | POA: Diagnosis not present

## 2015-11-07 NOTE — Progress Notes (Signed)
Subjective:    Patient ID: Roger Green is a 45 y.o. male.  HPI     Interim history:   Roger Green is a 45 year old right-handed gentleman with an underlying medical history of reflux disease, and morbid obesity, who presents for follow-up consultation of his obstructive sleep apnea, on treatment with BiPAP. The patient is unaccompanied today. I last saw him on 05/09/2015, at which time he reported compliance with treatment and he was doing better, feeling better rested, with better quality sleep. He was hoping to pursue bariatric surgery for weight loss. He was encouraged to keep using his CPAP regularly.   Today, 11/07/2015: I reviewed his BiPAP compliance data from 10/04/2015 through 11/02/2015 which is a total of 30 days during which time he used his machine every night with percent used days greater than 4 hours at 97%, indicating excellent compliance with an average usage of 7 hours and 4 minutes, residual AHI 1.9 per hour, leak at times high with the 95th percentile at 50.2 L/m on a pressure of 17/13.   Today, 11/07/2015: He reports doing well with the BiPAP. He is still trying to pursue bariatric surgery, but it has been a long drawn out process. He got something into his L eye on Friday. Left eye is still irritated and painful.  Previously:  I first met him on 01/04/2015 at the request of his primary care physician, at which time the patient reported a prior history of OSA but he was no longer on BiPAP therapy. I invited him back for sleep study. He had a split-night sleep study on 02/21/2015 and I went over his test results with him in detail today. His baseline sleep efficiency was 86.5% with a latency to sleep of 12.5 minutes and wake after sleep onset of 11.5 minutes with moderate sleep fragmentation noted. He had an increased arousal index, he had absence of slow-wave sleep and 19.9% of REM sleep prior to CPAP initiation. REM latency was high normal. He had no significant PLMS, EKG  or EEG changes. He had loud and moderate snoring. He had a total AHI of 99.3 per hour, average oxygen saturation was only 84%, nadir was 56%. He was then titrated on CPAP therapy. Sleep efficiency on the second part of the study was 93.7%, sleep latency 9.5 minutes and wake after sleep onset was 7 minutes. Arousal index was improved. He had absence of slow-wave sleep and 21% of REM sleep. Average oxygen saturation was much improved at 92%, nadir was 82%, and snoring was eliminated, no significant PLMS or EKG changes were seen. He was started first on CPAP therapy at 5 cm and titrated to 20 cm but then switched to BiPAP due to high pressure requirement. He was titrated to a pressure of 17/13 cm, AHI was 0 per hour and that pressure. Based on the test results are prescribed BiPAP therapy for home use.  I reviewed his BiPAP compliance data from 04/05/2015 through 05/04/2015 which is a total of 30 days during which time he used his machine 100% with percent used days greater than 4 hours at 100%, indicating superb compliance with an average usage of 7 hours and 8 minutes, residual AHI low at 1.5 per hour, leak at times very high with the 95th percentile at 105.7 L/m on a pressure of 17/13 cm.  01/04/2015: He has a prior diagnosis of obstructive sleep apnea, but no longer is on CPAP therapy.  The patient was recently hospitalized from 10/28/2014 through 10/31/2014 for pneumonia  and hypoxic respiratory failure. I reviewed the hospital records including the discharge summary. He had acute respiratory failure with hypoxia, and was treated for community-acquired pneumonia. He was found to be in lactic acidosis and hypokalemic. During his hospitalization he was treated with AutoPAP and did well.  He had a sleep study about 10 years ago. He reports a diagnosis of severe obstructive sleep apnea. Prior test results are not available for my review today. He had a BiPAP machine through [place] and states his pressure was  22/18. This is a fairly high pressure. He was on it successfully for years but then started having difficulty with tolerance and sensations of gasping at night. He stopped using his machine. They felt the machine was also defective and his wife states that she took the machine back to the DME company 6 times and eventually he stopped using it and there was no resolution to the problem as I understand. He has struggled with his weight for as long as he can remember. At one time in the past he looked into bariatric surgery and was evaluated for this at [facility]. However, this did not come to fruition. He has discussed with you potentially pursuing bariatric surgical evaluation again. He would be willing to return for sleep study and be retested for sleep apnea and we start CPAP or BiPAP treatment if necessary. He reports daytime somnolence. He does not wake up rested. He has occasional morning headaches and occasional nocturia. His bedtime is around 9 PM and rise time is around 6 AM. He likes to nap when he can. 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 He had a tonsillectomy and adenoidectomy as a child. His Epworth sleepiness score is 13 out of 24 today, fatigue score is 45 out of 63 today. He denies restless leg symptoms.   Snoring, apneic pauses, gasping sounds and irregular breathing pattern are severe per wife. He is a nonsmoker. He drinks alcohol very rarely, maybe once a year, and caffeine occasionally, not necessarily daily.  His Past Medical History Is Significant For: Past Medical History  Diagnosis Date  . Morbid obesity ([place])   . OSA (obstructive sleep apnea)   . GERD (gastroesophageal reflux disease)   . History of ankle fracture     bilat as child  . CTS (carpal tunnel syndrome)     mild, uses QHS reinforced wrist splints, R>L  . Dysphagia   . Prediabetes   . Morbid obesity ([place])   . Hypokalemia   . GERD (gastroesophageal reflux disease)     His Past  Surgical History Is Significant For: No past surgical history on file.  His Family History Is Significant For: Family History  Problem Relation Age of Onset  . Heart disease Father     MIs in his 63s  . Cancer Neg Hx   . Stroke Neg Hx   . Hyperlipidemia Neg Hx   . Hypertension Neg Hx   . Diabetes Other     maternal side in general    His Social History Is Significant For: Social History   Social History  . Marital Status: Married    Spouse Name: N/A  . Number of Children: 0  . Years of Education: HS    Occupational History  . Self Employeed     Social History Main Topics  . Smoking status: Never Smoker   . Smokeless tobacco: None  . Alcohol Use: No  . Drug Use: No  . Sexual  Activity: Not Asked   Other Topics Concern  . None   Social History Narrative   Married, no children, owns [employer], no exercise other than on the job; hunts and does a lot of outdoor thinks in the fall, traps game.   1 Soda a day    His Allergies Are:  No Known Allergies:   His Current Medications Are:  No outpatient encounter prescriptions on file as of 11/07/2015.   No facility-administered encounter medications on file as of 11/07/2015.  :  Review of Systems:  Out of a complete 14 point review of systems, all are reviewed and negative with the exception of these symptoms as listed below:   Review of Systems  Neurological:       Patient is here for f/u. No new concerns. Feels like he is doing well with CPAP.     Objective:  Neurologic Exam  Physical Exam Physical Examination:   Filed Vitals:   11/07/15 0926  BP: 136/62  Pulse: 82  Resp: 20    General Examination: The patient is a very pleasant 45 y.o. male in no acute distress. He appears well-developed and well-nourished and adequately groomed. He is morbidly obese. He is in good spirits today.  HEENT: Normocephalic, atraumatic, pupils are equal, round and reactive to light and accommodation. Extraocular  tracking is good without limitation to gaze excursion or nystagmus noted. Small/tiny black dot at 6 PM on L cornea. Mild conjunctival irritation L eye.  Normal smooth pursuit is noted. Hearing is grossly intact. Face is symmetric with normal facial animation and normal facial sensation. Speech is clear with no dysarthria noted. There is no hypophonia. There is no lip, neck/head, jaw or voice tremor. Neck is supple with full range of passive and active motion. There are no carotid bruits on auscultation. Oropharynx exam reveals: mild mouth dryness, adequate dental hygiene and marked airway crowding, due to narrow airway entry, large and swollen uvula, larger tongue. Mallampati is class III. Tongue protrudes centrally and palate elevates symmetrically. Tonsils are absent.   Chest: Clear to auscultation without wheezing, rhonchi or crackles noted.  Heart: S1+S2+0, regular and normal without murmurs, rubs or gallops noted.   Abdomen: Soft, non-tender and non-distended with normal bowel sounds appreciated on auscultation.  Extremities: There is trace to 1+ pitting edema in the distal lower extremities bilaterally, R > L, chronic stasis like changes noted. Pedal pulses are intact.  Skin: Warm and dry without trophic changes noted. There are no varicose veins.  Musculoskeletal: exam reveals no obvious joint deformities, tenderness or joint swelling or erythema, he had issues with both ankles.   Neurologically:  Mental status: The patient is awake, alert and oriented in all 4 spheres. His immediate and remote memory, attention, language skills and fund of knowledge are appropriate. There is no evidence of aphasia, agnosia, apraxia or anomia. Speech is clear with normal prosody and enunciation. Thought process is linear. Mood is normal and affect is normal.  Cranial nerves II - XII are as described above under HEENT exam. In addition: shoulder shrug is normal with equal shoulder height noted. Motor exam:  Normal bulk, strength and tone is noted. There is no drift, tremor or rebound. Romberg is negative. Reflexes are 1+ throughout. Fine motor skills and coordination: intact with normal finger taps, normal hand movements, normal rapid alternating patting, normal foot taps and normal foot agility.  Cerebellar testing: No dysmetria or intention tremor on finger to nose testing. Heel to shin is unremarkable bilaterally,  other than limited by body habitus. There is no truncal or gait ataxia.  Sensory exam: intact to light touch in the upper and lower extremities.  Gait, station and balance: He stands with difficulty. No veering to one side is noted. No leaning to one side is noted. Posture is age-appropriate and stance is narrow based. Gait shows normal stride length and normal pace. No problems turning are noted. He turns en bloc. Tandem walk is slightly difficult for him, likely due to body habitus, stable.               Assessment and Plan:   In summary, Roger Green is a very pleasant 45 year old male with an underlying medical history of reflux disease, and morbid obesity, who presents for follow up consultation of his severe obstructive sleep apnea, on BiPAP. He had a split-night sleep study on 02/21/2015 and I went over his test results with him in detail today. He did great on BiPAP therapy. He is compliant with treatment. His physical exam is stable. He is looking into bariatric surgery to help with weight loss, has restarted the process a few months ago in [place], previously went to [facility]. He has been morbidly obese for years. He has a beard which likely accounts for the increase in leak. He uses a fullface mask which is also more prone to air leaking. He is advised regarding his sleep study results again from his split night sleep study from 8/15 and his most recent compliance.    He had something get into his eye and there is a Small spot on the bottom corner of his left cornea. He is on his  way to the optometrist next. I again explained in particular the risks and ramifications of untreated moderate to severe OSA, especially with respect to developing cardiovascular disease down the Road, including congestive heart failure, difficult to treat hypertension, cardiac arrhythmias, or stroke. Even type 2 diabetes has, in part, been linked to untreated OSA. Symptoms of untreated OSA include daytime sleepiness, memory problems, mood irritability and mood disorder such as depression and anxiety, lack of energy, as well as recurrent headaches, especially morning headaches. We talked about trying to maintain a healthy lifestyle in general, as well as the importance of weight control. I encouraged the patient to eat healthy, exercise daily and keep well hydrated, to keep a scheduled bedtime and wake time routine, to not skip any meals and eat healthy snacks in between meals. I advised the patient not to drive when feeling sleepy.  I recommended the following at this time: Continue to be fully compliant with positive airway pressure treatment. Strive for weight loss. He is advised to drink more water to stay better hydrated.  I explained the importance of being compliant with PAP treatment, not only for insurance purposes but primarily to improve His symptoms, and for the patient's long term health benefit, including to reduce His cardiovascular risks. I would like to see him back in 12 months, sooner if needed. I answered all his questions today and the patient was in agreement. I encouraged him to call with any interim questions, concerns, problems or updates.   I spent 25 minutes in total face-to-face time with the patient, more than 50% of which was spent in counseling and coordination of care, reviewing test results, reviewing medication and discussing or reviewing the diagnosis of OSA, its prognosis and treatment options.

## 2015-11-07 NOTE — Patient Instructions (Signed)
Please continue using your BiPAP regularly. While your insurance requires that you use PAP at least 4 hours each night on 70% of the nights, I recommend, that you not skip any nights and use it throughout the night if you can. Getting used to PAP and staying with the treatment long term does take time and patience and discipline. Untreated obstructive sleep apnea when it is moderate to severe can have an adverse impact on cardiovascular health and raise her risk for heart disease, arrhythmias, hypertension, congestive heart failure, stroke and diabetes. Untreated obstructive sleep apnea causes sleep disruption, nonrestorative sleep, and sleep deprivation. This can have an impact on your day to day functioning and cause daytime sleepiness and impairment of cognitive function, memory loss, mood disturbance, and problems focussing. Using BiPAP regularly can improve these symptoms.  Keep up the good work! I will see you back in 12 months for sleep apnea check up.

## 2015-11-18 DIAGNOSIS — G4733 Obstructive sleep apnea (adult) (pediatric): Secondary | ICD-10-CM | POA: Diagnosis not present

## 2015-12-19 DIAGNOSIS — G4733 Obstructive sleep apnea (adult) (pediatric): Secondary | ICD-10-CM | POA: Diagnosis not present

## 2016-01-18 DIAGNOSIS — G4733 Obstructive sleep apnea (adult) (pediatric): Secondary | ICD-10-CM | POA: Diagnosis not present

## 2016-01-23 DIAGNOSIS — M109 Gout, unspecified: Secondary | ICD-10-CM | POA: Diagnosis not present

## 2016-01-23 DIAGNOSIS — Z0000 Encounter for general adult medical examination without abnormal findings: Secondary | ICD-10-CM | POA: Diagnosis not present

## 2016-01-23 DIAGNOSIS — R7309 Other abnormal glucose: Secondary | ICD-10-CM | POA: Diagnosis not present

## 2016-01-23 DIAGNOSIS — R8299 Other abnormal findings in urine: Secondary | ICD-10-CM | POA: Diagnosis not present

## 2016-01-30 DIAGNOSIS — Z0000 Encounter for general adult medical examination without abnormal findings: Secondary | ICD-10-CM | POA: Diagnosis not present

## 2016-01-30 DIAGNOSIS — Z1389 Encounter for screening for other disorder: Secondary | ICD-10-CM | POA: Diagnosis not present

## 2016-01-30 DIAGNOSIS — R808 Other proteinuria: Secondary | ICD-10-CM | POA: Diagnosis not present

## 2016-01-30 DIAGNOSIS — R3129 Other microscopic hematuria: Secondary | ICD-10-CM | POA: Diagnosis not present

## 2016-01-30 DIAGNOSIS — R7309 Other abnormal glucose: Secondary | ICD-10-CM | POA: Diagnosis not present

## 2016-01-30 DIAGNOSIS — E784 Other hyperlipidemia: Secondary | ICD-10-CM | POA: Diagnosis not present

## 2016-02-18 DIAGNOSIS — G4733 Obstructive sleep apnea (adult) (pediatric): Secondary | ICD-10-CM | POA: Diagnosis not present

## 2016-03-20 DIAGNOSIS — G4733 Obstructive sleep apnea (adult) (pediatric): Secondary | ICD-10-CM | POA: Diagnosis not present

## 2016-04-19 DIAGNOSIS — G4733 Obstructive sleep apnea (adult) (pediatric): Secondary | ICD-10-CM | POA: Diagnosis not present

## 2016-08-08 DIAGNOSIS — B349 Viral infection, unspecified: Secondary | ICD-10-CM | POA: Diagnosis not present

## 2016-08-08 DIAGNOSIS — R05 Cough: Secondary | ICD-10-CM | POA: Diagnosis not present

## 2016-08-08 DIAGNOSIS — J019 Acute sinusitis, unspecified: Secondary | ICD-10-CM | POA: Diagnosis not present

## 2016-08-08 DIAGNOSIS — Z6841 Body Mass Index (BMI) 40.0 and over, adult: Secondary | ICD-10-CM | POA: Diagnosis not present

## 2016-08-29 DIAGNOSIS — G4733 Obstructive sleep apnea (adult) (pediatric): Secondary | ICD-10-CM | POA: Diagnosis not present

## 2016-09-01 IMAGING — DX DG CHEST 2V
3 series · 3 of 3 positions shown · non-contrast
Comparison: None.

CLINICAL DATA: Cough, fever.

EXAM:
CHEST  2 VIEW

[chest pa]
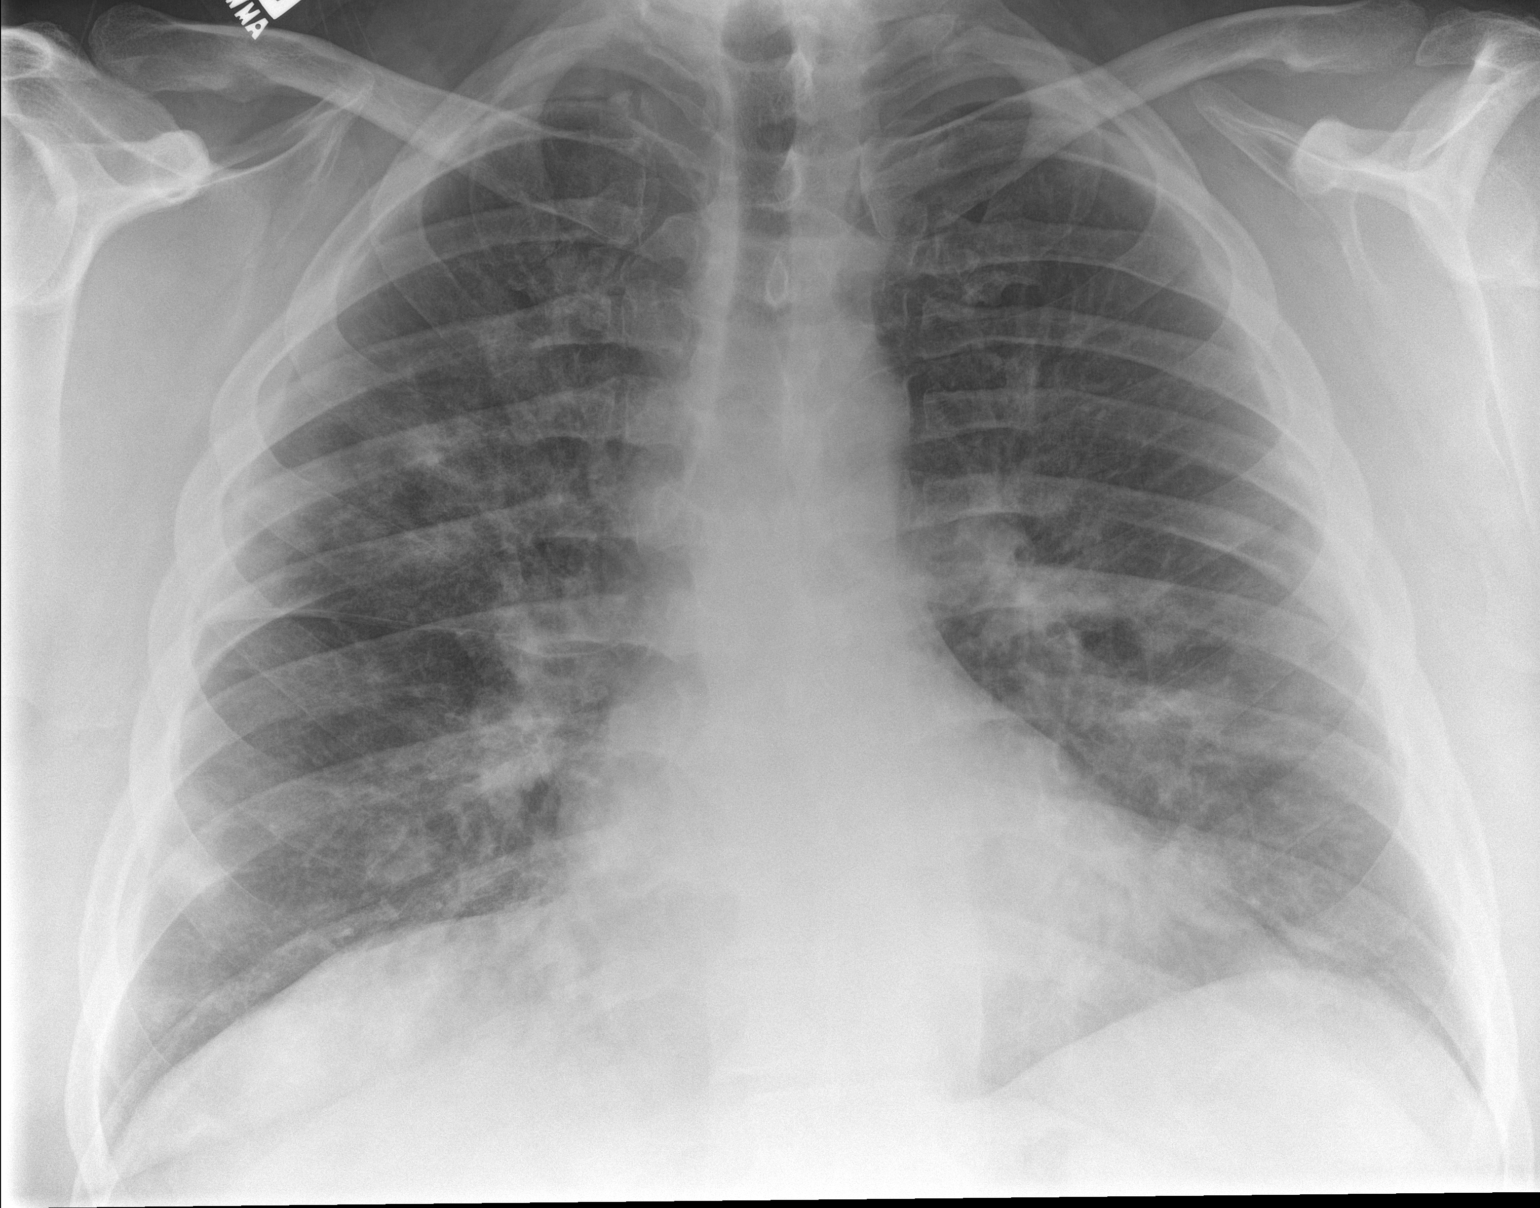

[chest lat (1 of 2)]
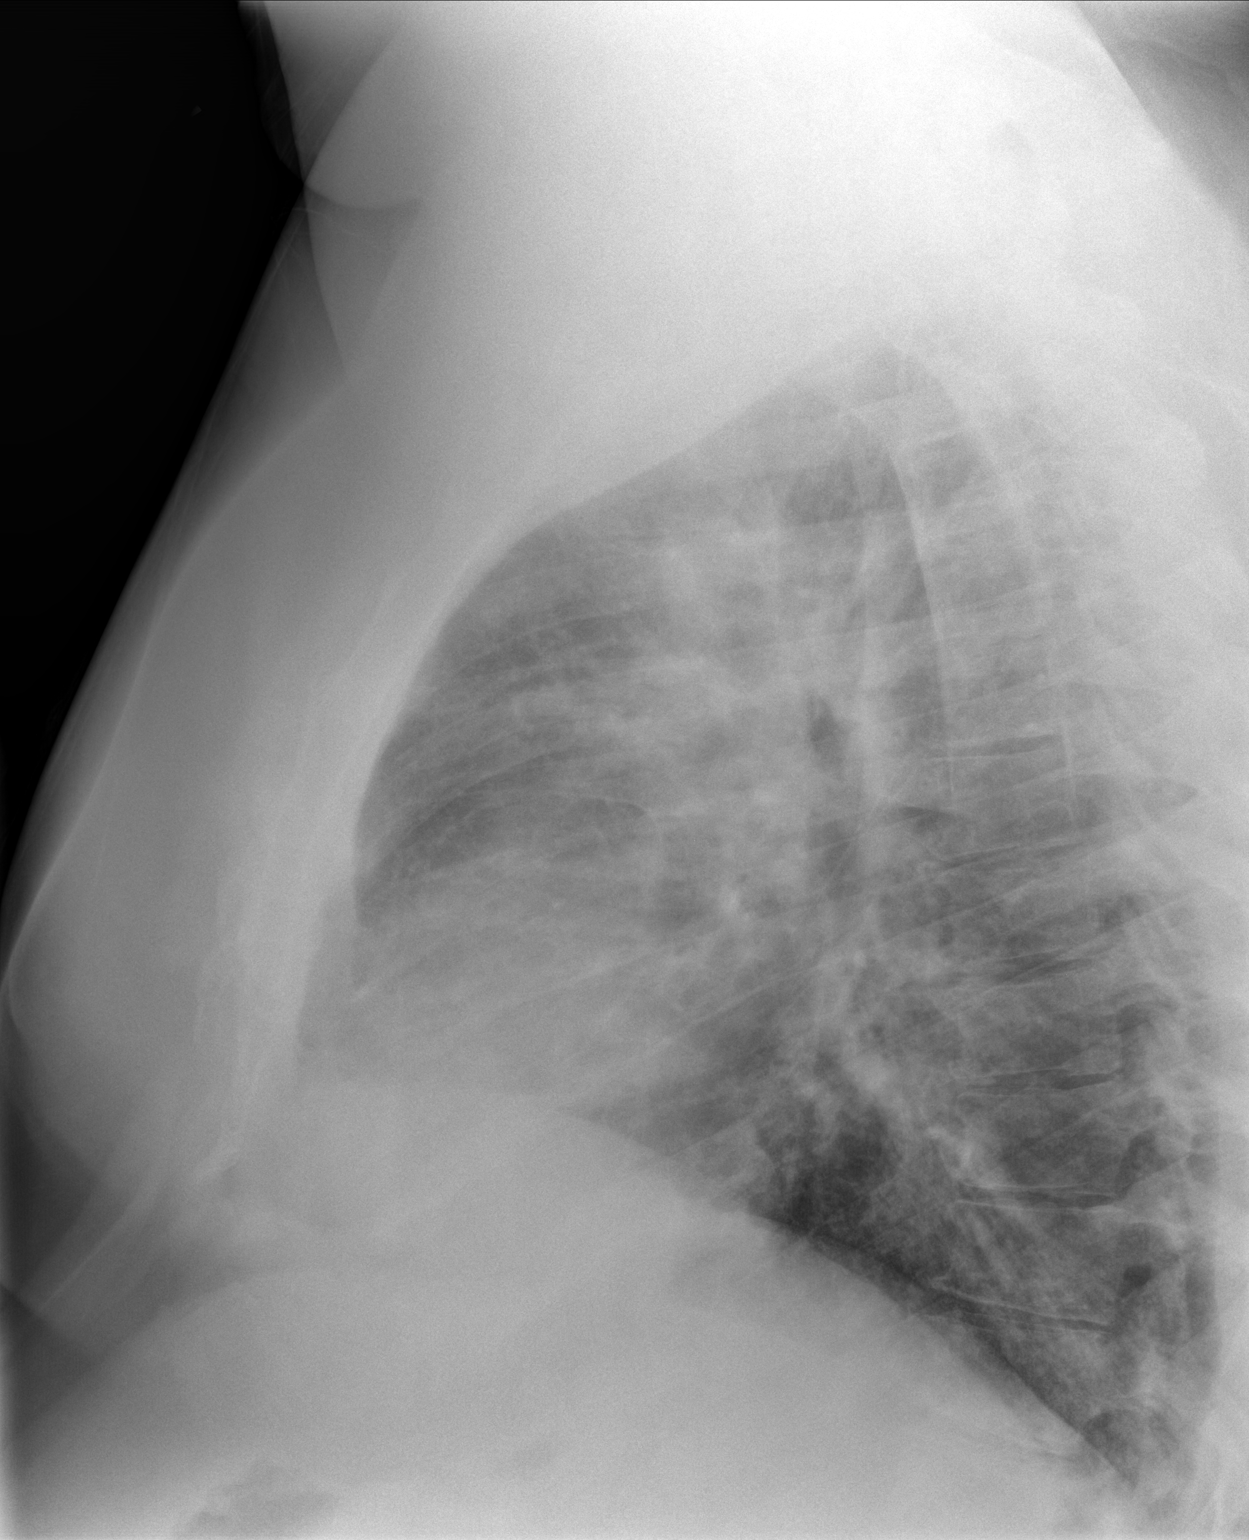

[chest lat (2 of 2)]
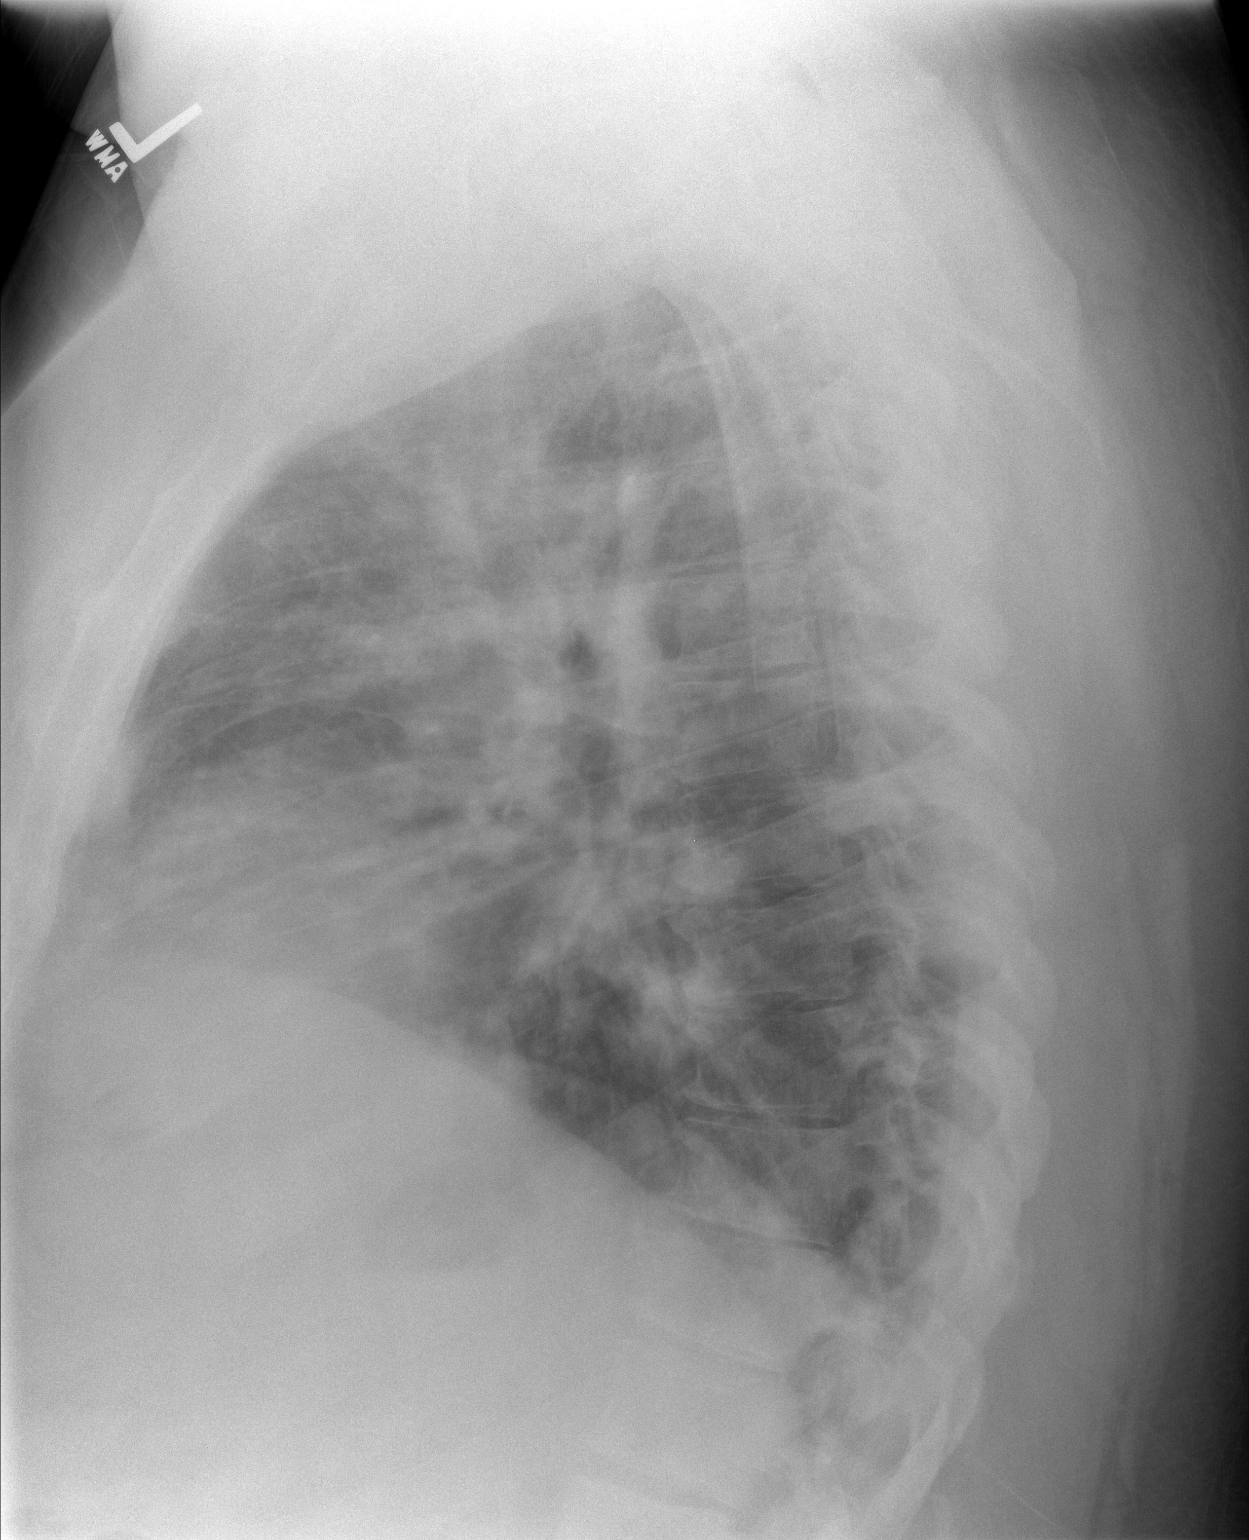

[3 of 3 positions shown; findings below may reference images not displayed]

FINDINGS: The heart size and mediastinal contours are within normal limits. No
pneumothorax or pleural effusion is noted patchy airspace opacity is
noted in the right upper lobe as well as the lingular region of the
left lung most consistent with pneumonia. The visualized skeletal
structures are unremarkable.
IMPRESSION: Bilateral airspace opacities are noted concerning for pneumonia.
Short-term follow-up radiographs are recommended to ensure
resolution.

## 2016-11-07 ENCOUNTER — Encounter: Payer: Self-pay | Admitting: Neurology

## 2016-11-07 ENCOUNTER — Ambulatory Visit (INDEPENDENT_AMBULATORY_CARE_PROVIDER_SITE_OTHER): Payer: BLUE CROSS/BLUE SHIELD | Admitting: Neurology

## 2016-11-07 VITALS — BP 136/68 | HR 80 | Resp 18 | Ht 74.0 in | Wt >= 6400 oz

## 2016-11-07 DIAGNOSIS — G4733 Obstructive sleep apnea (adult) (pediatric): Secondary | ICD-10-CM

## 2016-11-07 NOTE — Patient Instructions (Signed)
Please continue using your CPAP regularly. While your insurance requires that you use CPAP at least 4 hours each night on 70% of the nights, I recommend, that you not skip any nights and use it throughout the night if you can. Getting used to CPAP and staying with the treatment long term does take time and patience and discipline. Untreated obstructive sleep apnea when it is moderate to severe can have an adverse impact on cardiovascular health and raise her risk for heart disease, arrhythmias, hypertension, congestive heart failure, stroke and diabetes. Untreated obstructive sleep apnea causes sleep disruption, nonrestorative sleep, and sleep deprivation. This can have an impact on your day to day functioning and cause daytime sleepiness and impairment of cognitive function, memory loss, mood disturbance, and problems focussing. Using CPAP regularly can improve these symptoms. Keep up the good work with your CPAP! We can see you in 1 year, you can see one of our nurse practitioners as you are stable. I will see you after that.

## 2016-11-07 NOTE — Progress Notes (Signed)
Subjective:    Patient ID: Roger Green is a 46 y.o. male.  HPI     Interim history:  Roger Green is a 46 year old right-handed gentleman with an underlying medical history of reflux disease, and morbid obesity, who presents for follow-up consultation of his obstructive sleep apnea, on treatment with BiPAP. The patient is unaccompanied today. I last saw him on 11/07/2015, at which time he was compliant with BiPAP therapy, he was trying to pursue bariatric surgery. He was doing well as far as sleep apnea and treatment.  Today, 11/07/2016: I reviewed his BiPAP compliance data from 10/07/2016 through 11/05/2016, which is a total of 30 days, during which time he used his BiPAP every night with percent used days greater than 4 hours at 100%, indicating superb compliance with an average usage of 7 hours and 22 minutes, residual AHI 2.5 per hour, leak high with the 95th percentile at 48.3 L/m, pressure of 17/13. He reports doing well with BiPAP. He would like to have a second machine because he travels. Blood pressure generally speaking stable. Weight has been stable. He works in [employer]. He had no recent illness or change in his medications.  The patient's allergies, current medications, family history, past medical history, past social history, past surgical history and problem list were reviewed and updated as appropriate.   Previously (copied from previous notes for reference):   I saw him on 05/09/2015, at which time he reported compliance with treatment and he was doing better, feeling better rested, with better quality sleep. He was hoping to pursue bariatric surgery for weight loss. He was encouraged to keep using his CPAP regularly.    I reviewed his BiPAP compliance data from 10/04/2015 through 11/02/2015 which is a total of 30 days during which time he used his machine every night with percent used days greater than 4 hours at 97%, indicating excellent compliance with an average usage of  7 hours and 4 minutes, residual AHI 1.9 per hour, leak at times high with the 95th percentile at 50.2 L/m on a pressure of 17/13.    I first met him on 01/04/2015 at the request of his primary care physician, at which time the patient reported a prior history of OSA but he was no longer on BiPAP therapy. I invited him back for sleep study. He had a split-night sleep study on 02/21/2015 and I went over his test results with him in detail today. His baseline sleep efficiency was 86.5% with a latency to sleep of 12.5 minutes and wake after sleep onset of 11.5 minutes with moderate sleep fragmentation noted. He had an increased arousal index, he had absence of slow-wave sleep and 19.9% of REM sleep prior to CPAP initiation. REM latency was high normal. He had no significant PLMS, EKG or EEG changes. He had loud and moderate snoring. He had a total AHI of 99.3 per hour, average oxygen saturation was only 84%, nadir was 56%. He was then titrated on CPAP therapy. Sleep efficiency on the second part of the study was 93.7%, sleep latency 9.5 minutes and wake after sleep onset was 7 minutes. Arousal index was improved. He had absence of slow-wave sleep and 21% of REM sleep. Average oxygen saturation was much improved at 92%, nadir was 82%, and snoring was eliminated, no significant PLMS or EKG changes were seen. He was started first on CPAP therapy at 5 cm and titrated to 20 cm but then switched to BiPAP due to high pressure requirement. He  was titrated to a pressure of 17/13 cm, AHI was 0 per hour and that pressure. Based on the test results are prescribed BiPAP therapy for home use.   I reviewed his BiPAP compliance data from 04/05/2015 through 05/04/2015 which is a total of 30 days during which time he used his machine 100% with percent used days greater than 4 hours at 100%, indicating superb compliance with an average usage of 7 hours and 8 minutes, residual AHI low at 1.5 per hour, leak at times very high with the  95th percentile at 105.7 L/m on a pressure of 17/13 cm.   01/04/2015: He has a prior diagnosis of obstructive sleep apnea, but no longer is on CPAP therapy.  The patient was recently hospitalized from 10/28/2014 through 10/31/2014 for pneumonia and hypoxic respiratory failure. I reviewed the hospital records including the discharge summary. He had acute respiratory failure with hypoxia, and was treated for community-acquired pneumonia. He was found to be in lactic acidosis and hypokalemic. During his hospitalization he was treated with AutoPAP and did well.   He had a sleep study about 10 years ago. He reports a diagnosis of severe obstructive sleep apnea. Prior test results are not available for my review today. He had a BiPAP machine through [place] and states his pressure was 22/18. This is a fairly high pressure. He was on it successfully for years but then started having difficulty with tolerance and sensations of gasping at night. He stopped using his machine. They felt the machine was also defective and his wife states that she took the machine back to the DME company 6 times and eventually he stopped using it and there was no resolution to the problem as I understand. He has struggled with his weight for as long as he can remember. At one time in the past he looked into bariatric surgery and was evaluated for this at [facility]. However, this did not come to fruition. He has discussed with you potentially pursuing bariatric surgical evaluation again. He would be willing to return for sleep study and be retested for sleep apnea and we start CPAP or BiPAP treatment if necessary. He reports daytime somnolence. He does not wake up rested. He has occasional morning headaches and occasional nocturia. His bedtime is around 9 PM and rise time is around 6 AM. He likes to nap when he can. 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 He had a tonsillectomy and adenoidectomy as a child. His  Epworth sleepiness score is 13 out of 24 today, fatigue score is 45 out of 63 today. He denies restless leg symptoms.    Snoring, apneic pauses, gasping sounds and irregular breathing pattern are severe per wife. He is a nonsmoker. He drinks alcohol very rarely, maybe once a year, and caffeine occasionally, not necessarily daily.   His Past Medical History Is Significant For: Past Medical History:  Diagnosis Date  . CTS (carpal tunnel syndrome)    mild, uses QHS reinforced wrist splints, R>L  . Dysphagia   . GERD (gastroesophageal reflux disease)   . GERD (gastroesophageal reflux disease)   . History of ankle fracture    bilat as child  . Hypokalemia   . Morbid obesity ([place])   . Morbid obesity ([place])   . OSA (obstructive sleep apnea)   . Prediabetes     His Past Surgical History Is Significant For: No past surgical history on file.  His Family History Is Significant For: Family History  Problem Relation Age of Onset  . Heart disease Father     MIs in his 11s  . Cancer Neg Hx   . Stroke Neg Hx   . Hyperlipidemia Neg Hx   . Hypertension Neg Hx   . Diabetes Other     maternal side in general    His Social History Is Significant For: Social History   Social History  . Marital status: Married    Spouse name: N/A  . Number of children: 0  . Years of education: HS    Occupational History  . Self Employeed     Social History Main Topics  . Smoking status: Never Smoker  . Smokeless tobacco: Never Used  . Alcohol use No  . Drug use: No  . Sexual activity: Not Asked   Other Topics Concern  . None   Social History Narrative   Married, no children, owns [employer], no exercise other than on the job; hunts and does a lot of outdoor thinks in the fall, traps game.   1 Soda a day    His Allergies Are:  No Known Allergies:   His Current Medications Are:  No outpatient encounter prescriptions on file as of 11/07/2016.   No facility-administered encounter  medications on file as of 11/07/2016.   :  Review of Systems:  Out of a complete 14 point review of systems, all are reviewed and negative with the exception of these symptoms as listed below: Review of Systems  Neurological:       Patient states that he is doing well with CPAP. No new concerns.     Objective:  Neurologic Exam  Physical Exam Physical Examination:   Vitals:   11/07/16 0832  BP: 136/68  Pulse: 80  Resp: 18    General Examination: The patient is a very pleasant 46 y.o. male in no acute distress. He appears well-developed and well-nourished and well groomed.   HEENT: Normocephalic, atraumatic, pupils are equal, round and reactive to light and accommodation. Extraocular tracking is good without limitation to gaze excursion or nystagmus noted. Normal smooth pursuit is noted. Hearing is grossly intact. Face is symmetric with normal facial animation and normal facial sensation. Speech is clear with no dysarthria noted. There is no hypophonia. There is no lip, neck/head, jaw or voice tremor. Neck is supple with full range of passive and active motion. There are no carotid bruits on auscultation. Oropharynx exam reveals: mild mouth dryness, adequate dental hygiene and marked airway crowding. Mallampati is class III. Tongue protrudes centrally and palate elevates symmetrically.   Chest: Clear to auscultation without wheezing, rhonchi or crackles noted.  Heart: S1+S2+0, regular and normal without murmurs, rubs or gallops noted.   Abdomen: Soft, non-tender and non-distended with normal bowel sounds appreciated on auscultation.  Extremities: There is trace pitting edema in the distal lower extremities bilaterally. Pedal pulses are intact.  Skin: Warm and dry without trophic changes noted. Some dry scaly patches distal legs, he reports psoriasis.   Musculoskeletal: exam reveals no obvious joint deformities, tenderness or joint swelling or erythema.   Neurologically:  Mental  status: The patient is awake, alert and oriented in all 4 spheres. His immediate and remote memory, attention, language skills and fund of knowledge are appropriate. There is no evidence of aphasia, agnosia, apraxia or anomia. Speech is clear with normal prosody and enunciation. Thought process is linear. Mood is normal and affect is normal.  Cranial nerves II - XII are as described above under  HEENT exam. In addition: shoulder shrug is normal with equal shoulder height noted. Motor exam: Normal bulk, strength and tone is noted. There is no drift, tremor or rebound. Romberg is negative. Reflexes are 1+ throughout. Fine motor skills and coordination: intact grossly.  Cerebellar testing: No dysmetria or intention tremor. There is no truncal or gait ataxia.  Sensory exam: intact to light touch in the upper and lower extremities.  Gait, station and balance: He stands easily. No veering to one side is noted. No leaning to one side is noted. Posture is age-appropriate and stance is narrow based. Gait shows normal stride length and normal pace. No problems turning are noted. Tandem walk is challenging for him due to body habitus.             Assessment and Plan:  In summary, DEMETREUS LOTHAMER is a very pleasant 46 y.o.-year old male with an underlying medical history of reflux disease, psoriasis, and morbid obesity, who presents for follow-up consultation of his history of severe obstructive sleep apnea, well-established on BiPAP therapy. He had a split-night sleep study on 02/21/2015. He has been compliant with treatment for which he is commended. He has remained stable with respect to his blood pressure and weights. He is advised to try to continue to pursue weight loss and healthy lifestyle in general. Exam is stable. He requested a prescription for secondary BiPAP machine which I provided today. He is encouraged to talk to his current DME company about purchasing a second machine. I suggested a one-year checkup,  he can see one of her nurse practitioners as he is stable. I answered all his questions today and he was in agreement.  I spent 20 minutes in total face-to-face time with the patient, more than 50% of which was spent in counseling and coordination of care, reviewing test results, reviewing medication and discussing or reviewing the diagnosis of OSA, its prognosis and treatment options. Pertinent laboratory and imaging test results that were available during this visit with the patient were reviewed by me and considered in my medical decision making (see chart for details).

## 2016-12-07 DIAGNOSIS — G4733 Obstructive sleep apnea (adult) (pediatric): Secondary | ICD-10-CM | POA: Diagnosis not present

## 2017-03-05 DIAGNOSIS — Z6841 Body Mass Index (BMI) 40.0 and over, adult: Secondary | ICD-10-CM | POA: Diagnosis not present

## 2017-03-05 DIAGNOSIS — L298 Other pruritus: Secondary | ICD-10-CM | POA: Diagnosis not present

## 2017-03-05 DIAGNOSIS — R509 Fever, unspecified: Secondary | ICD-10-CM | POA: Diagnosis not present

## 2017-03-05 DIAGNOSIS — R21 Rash and other nonspecific skin eruption: Secondary | ICD-10-CM | POA: Diagnosis not present

## 2017-03-07 DIAGNOSIS — Z6841 Body Mass Index (BMI) 40.0 and over, adult: Secondary | ICD-10-CM | POA: Diagnosis not present

## 2017-03-07 DIAGNOSIS — R509 Fever, unspecified: Secondary | ICD-10-CM | POA: Diagnosis not present

## 2017-03-07 DIAGNOSIS — R21 Rash and other nonspecific skin eruption: Secondary | ICD-10-CM | POA: Diagnosis not present

## 2017-03-07 DIAGNOSIS — L299 Pruritus, unspecified: Secondary | ICD-10-CM | POA: Diagnosis not present

## 2017-03-08 DIAGNOSIS — L308 Other specified dermatitis: Secondary | ICD-10-CM | POA: Diagnosis not present

## 2017-03-08 DIAGNOSIS — L400 Psoriasis vulgaris: Secondary | ICD-10-CM | POA: Diagnosis not present

## 2017-03-08 DIAGNOSIS — B001 Herpesviral vesicular dermatitis: Secondary | ICD-10-CM | POA: Diagnosis not present

## 2017-03-14 DIAGNOSIS — Z6841 Body Mass Index (BMI) 40.0 and over, adult: Secondary | ICD-10-CM | POA: Diagnosis not present

## 2017-03-14 DIAGNOSIS — R509 Fever, unspecified: Secondary | ICD-10-CM | POA: Diagnosis not present

## 2017-03-14 DIAGNOSIS — R21 Rash and other nonspecific skin eruption: Secondary | ICD-10-CM | POA: Diagnosis not present

## 2017-03-14 DIAGNOSIS — A770 Spotted fever due to Rickettsia rickettsii: Secondary | ICD-10-CM | POA: Diagnosis not present

## 2017-04-22 DIAGNOSIS — E7849 Other hyperlipidemia: Secondary | ICD-10-CM | POA: Diagnosis not present

## 2017-04-22 DIAGNOSIS — Z125 Encounter for screening for malignant neoplasm of prostate: Secondary | ICD-10-CM | POA: Diagnosis not present

## 2017-04-22 DIAGNOSIS — M109 Gout, unspecified: Secondary | ICD-10-CM | POA: Diagnosis not present

## 2017-04-22 DIAGNOSIS — R7309 Other abnormal glucose: Secondary | ICD-10-CM | POA: Diagnosis not present

## 2017-04-26 DIAGNOSIS — E7849 Other hyperlipidemia: Secondary | ICD-10-CM | POA: Diagnosis not present

## 2017-04-26 DIAGNOSIS — Z0000 Encounter for general adult medical examination without abnormal findings: Secondary | ICD-10-CM | POA: Diagnosis not present

## 2017-04-26 DIAGNOSIS — H9190 Unspecified hearing loss, unspecified ear: Secondary | ICD-10-CM | POA: Diagnosis not present

## 2017-04-26 DIAGNOSIS — R197 Diarrhea, unspecified: Secondary | ICD-10-CM | POA: Diagnosis not present

## 2017-04-26 DIAGNOSIS — H6123 Impacted cerumen, bilateral: Secondary | ICD-10-CM | POA: Diagnosis not present

## 2017-04-26 DIAGNOSIS — E1165 Type 2 diabetes mellitus with hyperglycemia: Secondary | ICD-10-CM | POA: Diagnosis not present

## 2017-11-06 ENCOUNTER — Encounter: Payer: Self-pay | Admitting: Neurology

## 2017-11-11 ENCOUNTER — Ambulatory Visit: Payer: BLUE CROSS/BLUE SHIELD | Admitting: Neurology

## 2017-11-11 ENCOUNTER — Encounter: Payer: Self-pay | Admitting: Neurology

## 2017-11-11 VITALS — BP 149/85 | HR 69 | Ht 74.0 in | Wt >= 6400 oz

## 2017-11-11 DIAGNOSIS — G4733 Obstructive sleep apnea (adult) (pediatric): Secondary | ICD-10-CM | POA: Diagnosis not present

## 2017-11-11 NOTE — Progress Notes (Signed)
Subjective:    Patient ID: Roger Green is a 47 y.o. male.  HPI     Interim history: Roger Green is a 47 year old right-handed gentleman with an underlying medical history of reflux disease, and morbid obesity, who presents for follow-up consultation of his obstructive sleep apnea, on treatment with BiPAP. The patient is unaccompanied today. I last saw him on 11/07/2016, at which time he reported doing well with BiPAP. He requested a prescription for secondary machine for traveling.   Today, 11/11/2017: I reviewed his BiPAP compliance data from 10/08/2017 through 11/06/2017 which is a total of 30 days, during which time he used his BiPAP every night with percent used days greater than 4 hours at 100%, indicating superb compliance with an average usage of 7 hours and 50 minutes, residual AHI at goal at 1.2 per hour, leak high with the 95th percentile at 65.2 L/m and a pressure of 17/13. He reports that BiPAP is going well. He is a little less with the weight. His tremor and work on it. Blood pressure generally is better than today, has a follow-up PCP for his yearly physical coming up.    The patient's allergies, current medications, family history, past medical history, past social history, past surgical history and problem list were reviewed and updated as appropriate.    Previously (copied from previous notes for reference):   I saw him on 11/07/2015, at which time he was compliant with BiPAP therapy, he was trying to pursue bariatric surgery. He was doing well as far as sleep apnea and treatment.   I reviewed his BiPAP compliance data from 10/07/2016 through 11/05/2016, which is a total of 30 days, during which time he used his BiPAP every night with percent used days greater than 4 hours at 100%, indicating superb compliance with an average usage of 7 hours and 22 minutes, residual AHI 2.5 per hour, leak high with the 95th percentile at 48.3 L/m, pressure of 17/13.  I saw him on 05/09/2015,  at which time he reported compliance with treatment and he was doing better, feeling better rested, with better quality sleep. He was hoping to pursue bariatric surgery for weight loss. He was encouraged to keep using his CPAP regularly.    I reviewed his BiPAP compliance data from 10/04/2015 through 11/02/2015 which is a total of 30 days during which time he used his machine every night with percent used days greater than 4 hours at 97%, indicating excellent compliance with an average usage of 7 hours and 4 minutes, residual AHI 1.9 per hour, leak at times high with the 95th percentile at 50.2 L/m on a pressure of 17/13.    I first met him on 01/04/2015 at the request of his primary care physician, at which time the patient reported a prior history of OSA but he was no longer on BiPAP therapy. I invited him back for sleep study. He had a split-night sleep study on 02/21/2015 and I went over his test results with him in detail today. His baseline sleep efficiency was 86.5% with a latency to sleep of 12.5 minutes and wake after sleep onset of 11.5 minutes with moderate sleep fragmentation noted. He had an increased arousal index, he had absence of slow-wave sleep and 19.9% of REM sleep prior to CPAP initiation. REM latency was high normal. He had no significant PLMS, EKG or EEG changes. He had loud and moderate snoring. He had a total AHI of 99.3 per hour, average oxygen saturation was only  84%, nadir was 56%. He was then titrated on CPAP therapy. Sleep efficiency on the second part of the study was 93.7%, sleep latency 9.5 minutes and wake after sleep onset was 7 minutes. Arousal index was improved. He had absence of slow-wave sleep and 21% of REM sleep. Average oxygen saturation was much improved at 92%, nadir was 82%, and snoring was eliminated, no significant PLMS or EKG changes were seen. He was started first on CPAP therapy at 5 cm and titrated to 20 cm but then switched to BiPAP due to high pressure  requirement. He was titrated to a pressure of 17/13 cm, AHI was 0 per hour and that pressure. Based on the test results are prescribed BiPAP therapy for home use.   I reviewed his BiPAP compliance data from 04/05/2015 through 05/04/2015 which is a total of 30 days during which time he used his machine 100% with percent used days greater than 4 hours at 100%, indicating superb compliance with an average usage of 7 hours and 8 minutes, residual AHI low at 1.5 per hour, leak at times very high with the 95th percentile at 105.7 L/m on a pressure of 17/13 cm.   01/04/2015: He has a prior diagnosis of obstructive sleep apnea, but no longer is on CPAP therapy.  The patient was recently hospitalized from 10/28/2014 through 10/31/2014 for pneumonia and hypoxic respiratory failure. I reviewed the hospital records including the discharge summary. He had acute respiratory failure with hypoxia, and was treated for community-acquired pneumonia. He was found to be in lactic acidosis and hypokalemic. During his hospitalization he was treated with AutoPAP and did well.   He had a sleep study about 10 years ago. He reports a diagnosis of severe obstructive sleep apnea. Prior test results are not available for my review today. He had a BiPAP machine through [place] and states his pressure was 22/18. This is a fairly high pressure. He was on it successfully for years but then started having difficulty with tolerance and sensations of gasping at night. He stopped using his machine. They felt the machine was also defective and his wife states that she took the machine back to the DME company 6 times and eventually he stopped using it and there was no resolution to the problem as I understand. He has struggled with his weight for as long as he can remember. At one time in the past he looked into bariatric surgery and was evaluated for this at [facility]. However, this did not come to fruition. He has discussed with you potentially  pursuing bariatric surgical evaluation again. He would be willing to return for sleep study and be retested for sleep apnea and we start CPAP or BiPAP treatment if necessary. He reports daytime somnolence. He does not wake up rested. He has occasional morning headaches and occasional nocturia. His bedtime is around 9 PM and rise time is around 6 AM. He likes to nap when he can. 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 He had a tonsillectomy and adenoidectomy as a child. His Epworth sleepiness score is 13 out of 24 today, fatigue score is 45 out of 63 today. He denies restless leg symptoms.   Snoring, apneic pauses, gasping sounds and irregular breathing pattern are severe per wife. He is a nonsmoker. He drinks alcohol very rarely, maybe once a year, and caffeine occasionally, not necessarily daily.  His Past Medical History Is Significant For: Past Medical History:  Diagnosis Date  .  CTS (carpal tunnel syndrome)    mild, uses QHS reinforced wrist splints, R>L  . Dysphagia   . GERD (gastroesophageal reflux disease)   . GERD (gastroesophageal reflux disease)   . History of ankle fracture    bilat as child  . Hypokalemia   . Morbid obesity ([place])   . Morbid obesity ([place])   . OSA (obstructive sleep apnea)   . Prediabetes     His Past Surgical History Is Significant For: No past surgical history on file.  His Family History Is Significant For: Family History  Problem Relation Age of Onset  . Heart disease Father        MIs in his 103s  . Cancer Neg Hx   . Stroke Neg Hx   . Hyperlipidemia Neg Hx   . Hypertension Neg Hx   . Diabetes Other        maternal side in general    His Social History Is Significant For: Social History   Socioeconomic History  . Marital status: Married    Spouse name: Not on file  . Number of children: 0  . Years of education: HS   . Highest education level: Not on file  Occupational History  . Occupation: Self Employeed   Social  Needs  . Financial resource strain: Not on file  . Food insecurity:    Worry: Not on file    Inability: Not on file  . Transportation needs:    Medical: Not on file    Non-medical: Not on file  Tobacco Use  . Smoking status: Never Smoker  . Smokeless tobacco: Never Used  Substance and Sexual Activity  . Alcohol use: No    Alcohol/week: 0.0 oz  . Drug use: No  . Sexual activity: Not on file  Lifestyle  . Physical activity:    Days per week: Not on file    Minutes per session: Not on file  . Stress: Not on file  Relationships  . Social connections:    Talks on phone: Not on file    Gets together: Not on file    Attends religious service: Not on file    Active member of club or organization: Not on file    Attends meetings of clubs or organizations: Not on file    Relationship status: Not on file  Other Topics Concern  . Not on file  Social History Narrative   Married, no children, owns [employer], no exercise other than on the job; hunts and does a lot of outdoor thinks in the fall, traps game.   1 Soda a day    His Allergies Are:  No Known Allergies:   His Current Medications Are:  No outpatient encounter medications on file as of 11/11/2017.   No facility-administered encounter medications on file as of 11/11/2017.   :  Review of Systems:  Out of a complete 14 point review of systems, all are reviewed and negative with the exception of these symptoms as listed below: Review of Systems  Neurological:       Pt presents today to discuss his bipap. Pt reports that his bipap is going well.    Objective:  Neurological Exam  Physical Exam Physical Examination:   Vitals:   11/11/17 0824  BP: (!) 149/85  Pulse: 69    General Examination: The patient is a very pleasant 47 y.o. male in no acute distress. He appears well-developed and well-nourished and well groomed. Good spirits.  HEENT: Normocephalic, atraumatic, pupils  are equal, round and reactive to  light and accommodation. Extraocular tracking is good without limitation to gaze excursion or nystagmus noted. Normal smooth pursuit is noted. Hearing is grossly intact. Face is symmetric with normal facial animation and normal facial sensation. Speech is clear with no dysarthria noted. There is no hypophonia. There is no lip, neck/head, jaw or voice tremor. Neck is supple with full range of passive and active motion. Oropharynx exam reveals: no significant mouth dryness, adequate dental hygiene and marked airway crowding. Mallampati is class III. Tongue protrudes centrally and palate elevates symmetrically.   Chest: Clear to auscultation without wheezing, rhonchi or crackles noted.  Heart: S1+S2+0, regular and normal without murmurs, rubs or gallops noted.   Abdomen: Soft, non-tender and non-distended with normal bowel sounds appreciated on auscultation.  Extremities: There is no itting edema in the distal lower extremities bilaterally.   Skin: Warm and dry without trophic changes noted. very little scaly patches distal legs, he reports psoriasis.   Musculoskeletal: exam reveals no obvious joint deformities, tenderness or joint swelling or erythema, with the exception of right foot pointing out, this is not a new finding, broken ankle when he was a child.   Neurologically:  Mental status: The patient is awake, alert and oriented in all 4 spheres. His immediate and remote memory, attention, language skills and fund of knowledge are appropriate. There is no evidence of aphasia, agnosia, apraxia or anomia. Speech is clear with normal prosody and enunciation. Thought process is linear. Mood is normal and affect is normal.  Cranial nerves II - XII are as described above under HEENT exam.  Motor exam: Normal bulk, strength and tone is noted. There is no drift, tremor or rebound. Romberg is negative. Fine motor skills and coordination: intact grossly.  Cerebellar testing: No dysmetria or intention  tremor. There is no truncal or gait ataxia.  Sensory exam: intact to light touch in the upper and lower extremities.  Gait, station and balance: He stands easily. No veering to one side is noted. No leaning to one side is noted. Posture is age-appropriate and stance is narrow based. Gait shows normal stride length and normal pace. No problems turning are noted. Tandem walk is challenging for him due to body habitus.             Assessment and Plan:  In summary, Roger Green is a very pleasant 47 year old male with an underlying medical history of reflux disease, psoriasis, and morbid obesity, who presents for follow-up consultation of his history of severe obstructive sleep apnea, well-established on BiPAP therapy. He had a split-night sleep study on 02/21/2015. He has been compliant with treatment for which he is commended. His weight is about 9 pounds less than last year around this time, he is commended for his weight loss endeavor encouraged to continue to work on it. He is advised to be good about replacing his supplies and mouth. He does admit that he may use his supplies longer than recommended. His leak tends to be high because he uses a fullface mask and has a full beard. Nevertheless, his apnea is under good control and he tolerates the treatment settings. He is advised to follow-up in one year, he continue one of our nurse practitioners as he is stable. I answered all his questions today and he was in agreement. I spent 20 minutes in total face-to-face time with the patient, more than 50% of which was spent in counseling and coordination of care, reviewing test  results, reviewing medication and discussing or reviewing the diagnosis of OSA, its prognosis and treatment options. Pertinent laboratory and imaging test results that were available during this visit with the patient were reviewed by me and considered in my medical decision making (see chart for details).

## 2017-11-11 NOTE — Patient Instructions (Addendum)
We will need to replace all your CPAP supplies, including, mask, hose, filter and headgear, in a timely fashion to stay healthy with your BiPAP. I have placed an order for this and we will send it to your DME company; they should be in touch with you, you may also need a new humidifier unit.   Keep up the good work! We can see you in 1 year, you can see one of our nurse practitioners as you are stable. I will see you after that.

## 2018-04-17 DIAGNOSIS — Z6841 Body Mass Index (BMI) 40.0 and over, adult: Secondary | ICD-10-CM | POA: Diagnosis not present

## 2018-04-17 DIAGNOSIS — J209 Acute bronchitis, unspecified: Secondary | ICD-10-CM | POA: Diagnosis not present

## 2018-04-17 DIAGNOSIS — R05 Cough: Secondary | ICD-10-CM | POA: Diagnosis not present

## 2018-06-26 ENCOUNTER — Encounter: Payer: Self-pay | Admitting: Neurology

## 2018-11-13 ENCOUNTER — Ambulatory Visit: Payer: BLUE CROSS/BLUE SHIELD | Admitting: Nurse Practitioner

## 2020-03-30 ENCOUNTER — Other Ambulatory Visit: Payer: Self-pay | Admitting: Neurology

## 2020-03-30 DIAGNOSIS — R202 Paresthesia of skin: Secondary | ICD-10-CM

## 2020-03-30 DIAGNOSIS — M6281 Muscle weakness (generalized): Secondary | ICD-10-CM

## 2020-04-25 ENCOUNTER — Other Ambulatory Visit: Payer: Self-pay

## 2020-12-07 DEATH — deceased
